# Patient Record
Sex: Male | Born: 1995 | Race: White | Hispanic: No | Marital: Single | Smoking: Never smoker
Health system: Southern US, Community
[De-identification: ages and names within clinical notes are randomized; demographics above are authoritative.]

## PROBLEM LIST (undated history)

## (undated) DIAGNOSIS — J343 Hypertrophy of nasal turbinates: Secondary | ICD-10-CM

## (undated) DIAGNOSIS — J342 Deviated nasal septum: Secondary | ICD-10-CM

## (undated) DIAGNOSIS — J3489 Other specified disorders of nose and nasal sinuses: Secondary | ICD-10-CM

## (undated) DIAGNOSIS — F988 Other specified behavioral and emotional disorders with onset usually occurring in childhood and adolescence: Secondary | ICD-10-CM

## (undated) HISTORY — PX: WISDOM TOOTH EXTRACTION: SHX21

---
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
## 2015-08-21 NOTE — Discharge Instructions (Signed)
[facility] °[facility] °ENDOSCOPIC SINUS SURGERY °[facility] ° °What is Functional Endoscopic Sinus Surgery? ° The Surgery involves making the natural openings of the sinuses larger by removing the bony partitions that separate the sinuses from the nasal cavity.  The natural sinus lining is preserved as much as possible to allow the sinuses to resume normal function after the surgery.  In some patients nasal polyps (excessively swollen lining of the sinuses) may be removed to relieve obstruction of the sinus openings.  The surgery is performed through the nose using lighted scopes, which eliminates the need for incisions on the face.  A septoplasty is a different procedure which is sometimes performed with sinus surgery.  It involves straightening the boy partition that separates the two sides of your nose.  A crooked or deviated septum may need repair if is obstructing the sinuses or nasal airflow.  Turbinate reduction is also often performed during sinus surgery.  The turbinates are bony proturberances from the side walls of the nose which swell and can obstruct the nose in patients with sinus and allergy problems.  Their size can be surgically reduced to help relieve nasal obstruction. ° °What Can Sinus Surgery Do For Me? ° Sinus surgery can reduce the frequency of sinus infections requiring antibiotic treatment.  This can provide improvement in nasal congestion, post-nasal drainage, facial pressure and nasal obstruction.  Surgery will NOT prevent you from ever having an infection again, so it usually only for patients who get infections 4 or more times yearly requiring antibiotics, or for infections that do not clear with antibiotics.  It will not cure nasal allergies, so patients with allergies may still require medication to treat their allergies after surgery. Surgery may improve headaches related to sinusitis, however, some people will continue to  require medication to control sinus headaches related to allergies.  Surgery will do nothing for other forms of headache (migraine, tension or cluster). ° °What Are the Risks of Endoscopic Sinus Surgery? ° Current techniques allow surgery to be performed safely with little risk, however, there are rare complications that patients should be aware of.  Because the sinuses are located around the eyes, there is risk of eye injury, including blindness, though again, this would be quite rare. This is usually a result of bleeding behind the eye during surgery, which puts the vision oat risk, though there are treatments to protect the vision and prevent permanent disrupted by surgery causing a leak of the spinal fluid that surrounds the brain.  More serious complications would include bleeding inside the brain cavity or damage to the brain.  Again, all of these complications are uncommon, and spinal fluid leaks can be safely managed surgically if they occur.  The most common complication of sinus surgery is bleeding from the nose, which may require packing or cauterization of the nose.  Continued sinus have polyps may experience recurrence of the polyps requiring revision surgery.  Alterations of sense of smell or injury to the tear ducts are also rare complications.  ° °What is the Surgery Like, and what is the Recovery? ° The Surgery usually takes a couple of hours to perform, and is usually performed under a general anesthetic (completely asleep).  Patients are usually discharged home after a couple of hours.  Sometimes during surgery it is necessary to pack the nose to control bleeding, and the packing is left in place for 24 - 48 hours, and removed by your surgeon.    If a septoplasty was performed during the procedure, there is often a splint placed which must be removed after 5-7 days.   °Discomfort: Pain is usually mild to moderate, and can be controlled by prescription pain medication or acetaminophen (Tylenol).   Aspirin, Ibuprofen (Advil, Motrin), or Naprosyn (Aleve) should be avoided, as they can cause increased bleeding.  Most patients feel sinus pressure like they have a bad head cold for several days.  Sleeping with your head elevated can help reduce swelling and facial pressure, as can ice packs over the face.  A humidifier may be helpful to keep the mucous and blood from drying in the nose.  ° °Diet: There are no specific diet restrictions, however, you should generally start with clear liquids and a light diet of bland foods because the anesthetic can cause some nausea.  Advance your diet depending on how your stomach feels.  Taking your pain medication with food will often help reduce stomach upset which pain medications can cause. ° °Nasal Saline Irrigation: It is important to remove blood clots and dried mucous from the nose as it is healing.  This is done by having you irrigate the nose at least 3 - 4 times daily with a salt water solution.  We recommend using NeilMed Sinus Rinse (available at the drug store).  Fill the squeeze bottle with the solution, bend over a sink, and insert the tip of the squeeze bottle into the nose ½ of an inch.  Point the tip of the squeeze bottle towards the inside corner of the eye on the same side your irrigating.  Squeeze the bottle and gently irrigate the nose.  If you bend forward as you do this, most of the fluid will flow back out of the nose, instead of down your throat.   The solution should be warm, near body temperature, when you irrigate.   Each time you irrigate, you should use a full squeeze bottle.  ° °Note that if you are instructed to use Nasal Steroid Sprays at any time after your surgery, irrigate with saline BEFORE using the steroid spray, so you do not wash it all out of the nose. °Another product, Nasal Saline Gel (such as AYR Nasal Saline Gel) can be applied in each nostril 3 - 4 times daily to moisture the nose and reduce scabbing or crusting. ° °Bleeding:   Bloody drainage from the nose can be expected for several days, and patients are instructed to irrigate their nose frequently with salt water to help remove mucous and blood clots.  The drainage may be dark red or brown, though some fresh blood may be seen intermittently, especially after irrigation.  Do not blow you nose, as bleeding may occur. If you must sneeze, keep your mouth open to allow air to escape through your mouth. ° °If heavy bleeding occurs: Irrigate the nose with saline to rinse out clots, then spray the nose 3 - 4 times with Afrin Nasal Decongestant Spray.  The spray will constrict the blood vessels to slow bleeding.  Pinch the lower half of your nose shut to apply pressure, and lay down with your head elevated.  Ice packs over the nose may help as well. If bleeding persists despite these measures, you should notify your doctor.  Do not use the Afrin routinely to control nasal congestion after surgery, as it can result in worsening congestion and may affect healing.  ° ° ° °Activity: Return to work varies among patients. Most patients will be   out of work at least 5 - 7 days to recover.  Patient may return to work after they are off of narcotic pain medication, and feeling well enough to perform the functions of their job.  Patients must avoid heavy lifting (over 10 pounds) or strenuous physical for 2 weeks after surgery, so your employer may need to assign you to light duty, or keep you out of work longer if light duty is not possible.  NOTE: you should not drive, operate dangerous machinery, do any mentally demanding tasks or make any important legal or financial decisions while on narcotic pain medication and recovering from the general anesthetic.  °  °Call Your Doctor Immediately if You Have Any of the Following: °1. Bleeding that you cannot control with the above measures °2. Loss of vision, double vision, bulging of the eye or black eyes. °3. Fever over 101 degrees °4. Neck stiffness with  severe headache, fever, nausea and change in mental state. °You are always encourage to call anytime with concerns, however, please call with requests for pain medication refills during office hours. ° °Office Endoscopy: During follow-up visits your doctor will remove any packing or splints that may have been placed and evaluate and clean your sinuses endoscopically.  Topical anesthetic will be used to make this as comfortable as possible, though you may want to take your pain medication prior to the visit.  How often this will need to be done varies from patient to patient.  After complete recovery from the surgery, you may need follow-up endoscopy from time to time, particularly if there is concern of recurrent infection or nasal polyps. ° °General Anesthesia, Adult, Care After °Refer to this sheet in the next few weeks. These instructions provide you with information on caring for yourself after your procedure. Your health care provider may also give you more specific instructions. Your treatment has been planned according to current medical practices, but problems sometimes occur. Call your health care provider if you have any problems or questions after your procedure. °WHAT TO EXPECT AFTER THE PROCEDURE °After the procedure, it is typical to experience: °· Sleepiness. °· Nausea and vomiting. °HOME CARE INSTRUCTIONS °· For the first 24 hours after general anesthesia: °¨ Have a responsible person with you. °¨ Do not drive a car. If you are alone, do not take public transportation. °¨ Do not drink alcohol. °¨ Do not take medicine that has not been prescribed by your health care provider. °¨ Do not sign important papers or make important decisions. °¨ You may resume a normal diet and activities as directed by your health care provider. °· Change bandages (dressings) as directed. °· If you have questions or problems that seem related to general anesthesia, call the hospital and ask for the anesthetist or  anesthesiologist on call. °SEEK MEDICAL CARE IF: °· You have nausea and vomiting that continue the day after anesthesia. °· You develop a rash. °SEEK IMMEDIATE MEDICAL CARE IF:  °· You have difficulty breathing. °· You have chest pain. °· You have any allergic problems. °  °This information is not intended to replace advice given to you by your health care provider. Make sure you discuss any questions you have with your health care provider. °  °Document Released: 12/13/2000 Document Revised: 09/27/2014 Document Reviewed: 01/05/2012 °Elsevier Interactive Patient Education ©2016 [employer] ° °

## 2015-08-22 ENCOUNTER — Encounter
Admission: RE | Disposition: A | Discharge status: Home / Self Care | Payer: Self-pay | Source: Ambulatory Visit | Attending: Unknown Physician Specialty

## 2015-08-22 ENCOUNTER — Ambulatory Visit: Payer: BLUE CROSS/BLUE SHIELD | Admitting: Anesthesiology

## 2015-08-22 ENCOUNTER — Ambulatory Visit
Admission: RE | Admit: 2015-08-22 | Discharge: 2015-08-22 | Disposition: A | Discharge status: Home / Self Care | Payer: BLUE CROSS/BLUE SHIELD | Source: Ambulatory Visit | Attending: Unknown Physician Specialty | Admitting: Unknown Physician Specialty

## 2015-08-22 DIAGNOSIS — J343 Hypertrophy of nasal turbinates: Secondary | ICD-10-CM | POA: Diagnosis not present

## 2015-08-22 DIAGNOSIS — J342 Deviated nasal septum: Secondary | ICD-10-CM | POA: Diagnosis not present

## 2015-08-22 DIAGNOSIS — J3489 Other specified disorders of nose and nasal sinuses: Secondary | ICD-10-CM | POA: Diagnosis present

## 2015-08-22 HISTORY — PX: SEPTOPLASTY: SHX2393

## 2015-08-22 HISTORY — DX: Other specified disorders of nose and nasal sinuses: J34.89

## 2015-08-22 HISTORY — DX: Hypertrophy of nasal turbinates: J34.3

## 2015-08-22 HISTORY — DX: Deviated nasal septum: J34.2

## 2015-08-22 HISTORY — PX: TURBINATE RESECTION: SHX6158

## 2015-08-22 SURGERY — SEPTOPLASTY, NOSE
Anesthesia: General | Wound class: Clean Contaminated

## 2015-08-22 MED ORDER — MIDAZOLAM HCL 5 MG/5ML IJ SOLN
INTRAMUSCULAR | Status: DC | PRN
  Administered 2015-08-22: 2 mg via INTRAVENOUS

## 2015-08-22 MED ORDER — LACTATED RINGERS IV SOLN
INTRAVENOUS | Status: DC
  Administered 2015-08-22 (×2): via INTRAVENOUS

## 2015-08-22 MED ORDER — ACETAMINOPHEN 325 MG PO TABS: 325.0000 mg | ORAL_TABLET | ORAL | Status: DC | PRN

## 2015-08-22 MED ORDER — SULFAMETHOXAZOLE-TRIMETHOPRIM 400-80 MG PO TABS: 1.0000 | ORAL_TABLET | Freq: Two times a day (BID) | ORAL | Status: AC

## 2015-08-22 MED ORDER — OXYCODONE HCL 5 MG PO TABS
5.0000 mg | ORAL_TABLET | Freq: Once | ORAL | Status: AC | PRN
  Administered 2015-08-22: 5 mg via ORAL

## 2015-08-22 MED ORDER — HYDROMORPHONE HCL 1 MG/ML IJ SOLN
0.2500 mg | INTRAMUSCULAR | Status: DC | PRN
  Administered 2015-08-22 (×2): 0.4 mg via INTRAVENOUS
  Administered 2015-08-22: 0.2 mg via INTRAVENOUS

## 2015-08-22 MED ORDER — LIDOCAINE HCL 1 % IJ SOLN
INTRAMUSCULAR | Status: DC | PRN
  Administered 2015-08-22: 30 mL via TOPICAL

## 2015-08-22 MED ORDER — GLYCOPYRROLATE 0.2 MG/ML IJ SOLN
INTRAMUSCULAR | Status: DC | PRN
  Administered 2015-08-22: 0.1 mg via INTRAVENOUS

## 2015-08-22 MED ORDER — ONDANSETRON HCL 4 MG/2ML IJ SOLN: 4.0000 mg | Freq: Once | INTRAMUSCULAR | Status: DC | PRN

## 2015-08-22 MED ORDER — HYDROCODONE-ACETAMINOPHEN 5-300 MG PO TABS: 1.0000 | ORAL_TABLET | ORAL | Status: AC | PRN

## 2015-08-22 MED ORDER — ROCURONIUM BROMIDE 100 MG/10ML IV SOLN
INTRAVENOUS | Status: DC | PRN
  Administered 2015-08-22: 20 mg via INTRAVENOUS

## 2015-08-22 MED ORDER — ACETAMINOPHEN 160 MG/5ML PO SOLN: 325.0000 mg | ORAL | Status: DC | PRN

## 2015-08-22 MED ORDER — PROPOFOL 10 MG/ML IV BOLUS
INTRAVENOUS | Status: DC | PRN
  Administered 2015-08-22: 200 mg via INTRAVENOUS

## 2015-08-22 MED ORDER — LIDOCAINE HCL (CARDIAC) 20 MG/ML IV SOLN
INTRAVENOUS | Status: DC | PRN
  Administered 2015-08-22: 40 mg via INTRAVENOUS

## 2015-08-22 MED ORDER — OXYCODONE HCL 5 MG/5ML PO SOLN: 5.0000 mg | Freq: Once | ORAL | Status: AC | PRN

## 2015-08-22 MED ORDER — ONDANSETRON HCL 4 MG/2ML IJ SOLN
INTRAMUSCULAR | Status: DC | PRN
  Administered 2015-08-22: 4 mg via INTRAVENOUS

## 2015-08-22 MED ORDER — FENTANYL CITRATE (PF) 100 MCG/2ML IJ SOLN
INTRAMUSCULAR | Status: DC | PRN
  Administered 2015-08-22: 100 ug via INTRAVENOUS

## 2015-08-22 MED ORDER — LIDOCAINE-EPINEPHRINE 1 %-1:100000 IJ SOLN
INTRAMUSCULAR | Status: DC | PRN
  Administered 2015-08-22: 12 mL

## 2015-08-22 MED ORDER — DEXAMETHASONE SODIUM PHOSPHATE 4 MG/ML IJ SOLN
INTRAMUSCULAR | Status: DC | PRN
  Administered 2015-08-22: 10 mg via INTRAVENOUS

## 2015-08-22 SURGICAL SUPPLY — 30 items
BLADE SURG 15 STRL LF DISP TIS (BLADE) IMPLANT
BLADE SURG 15 STRL SS (BLADE)
COAG SUCT 10F 3.5MM HAND CTRL (MISCELLANEOUS) ×2 IMPLANT
DRAPE HEAD BAR (DRAPES) ×2 IMPLANT
DRESSING NASL FOAM PST OP SINU (MISCELLANEOUS) IMPLANT
DRSG NASAL FOAM POST OP SINU (MISCELLANEOUS)
GLOVE BIO SURGEON STRL SZ7.5 (GLOVE) ×4 IMPLANT
HANDLE YANKAUER SUCT BULB TIP (MISCELLANEOUS) ×2 IMPLANT
KIT ROOM TURNOVER OR (KITS) ×2 IMPLANT
NEEDLE HYPO 25GX1X1/2 BEV (NEEDLE) ×2 IMPLANT
NS IRRIG 500ML POUR BTL (IV SOLUTION) ×2 IMPLANT
PACK DRAPE NASAL/ENT (PACKS) ×2 IMPLANT
PAD GROUND ADULT SPLIT (MISCELLANEOUS) ×2 IMPLANT
SOL ANTI-FOG 6CC FOG-OUT (MISCELLANEOUS) ×1 IMPLANT
SOL FOG-OUT ANTI-FOG 6CC (MISCELLANEOUS) ×1
SPLINT NASAL SEPTAL BLV .25 LG (MISCELLANEOUS) IMPLANT
SPLINT NASAL SEPTAL BLV .50 ST (MISCELLANEOUS) ×2 IMPLANT
SPONGE NEURO XRAY DETECT 1X3 (DISPOSABLE) ×2 IMPLANT
STRAP BODY AND KNEE 60X3 (MISCELLANEOUS) ×2 IMPLANT
SUT CHROMIC 3-0 (SUTURE) ×1
SUT CHROMIC 3-0 KS 27XMFL CR (SUTURE) ×1
SUT CHROMIC 5-0 (SUTURE)
SUT CHROMIC 5-0 P2 18XMFL CR (SUTURE)
SUT ETHILON 3-0 KS 30 BLK (SUTURE) ×2 IMPLANT
SUT PLAIN GUT 4-0 (SUTURE) IMPLANT
SUTURE CHRMC 3-0 KS 27XMFL CR (SUTURE) ×1 IMPLANT
SUTURE CHRMC 5-0 P2 18XMF CR (SUTURE) IMPLANT
SYRINGE 10CC LL (SYRINGE) ×2 IMPLANT
TOWEL OR 17X26 4PK STRL BLUE (TOWEL DISPOSABLE) ×2 IMPLANT
WATER STERILE IRR 500ML POUR (IV SOLUTION) ×2 IMPLANT

## 2015-08-22 NOTE — Anesthesia Preprocedure Evaluation (Signed)
Anesthesia Evaluation  Patient identified by MRN, date of birth, ID band Patient awake    Reviewed: Allergy & Precautions, H&P , NPO status , Patient's Chart, lab work & pertinent test results, reviewed documented beta blocker date and time   Airway Mallampati: II  TM Distance: >3 FB Neck ROM: full    Dental no notable dental hx.    Pulmonary neg pulmonary ROS,    Pulmonary exam normal breath sounds clear to auscultation       Cardiovascular Exercise Tolerance: Good negative cardio ROS Normal cardiovascular exam Rhythm:regular Rate:Normal     Neuro/Psych negative neurological ROS  negative psych ROS   GI/Hepatic negative GI ROS, Neg liver ROS,   Endo/Other  negative endocrine ROS  Renal/GU negative Renal ROS  negative genitourinary   Musculoskeletal   Abdominal   Peds  Hematology negative hematology ROS (+)   Anesthesia Other Findings   Reproductive/Obstetrics negative OB ROS                             Anesthesia Physical Anesthesia Plan  ASA: I  Anesthesia Plan: General   Post-op Pain Management:    Induction: Intravenous  Airway Management Planned: Oral ETT  Additional Equipment:   Intra-op Plan:   Post-operative Plan: Extubation in OR  Informed Consent: I have reviewed the patients History and Physical, chart, labs and discussed the procedure including the risks, benefits and alternatives for the proposed anesthesia with the patient or authorized representative who has indicated his/her understanding and acceptance.   Dental Advisory Given  Plan Discussed with: CRNA  Anesthesia Plan Comments:         Anesthesia Quick Evaluation

## 2015-08-22 NOTE — Anesthesia Postprocedure Evaluation (Signed)
Anesthesia Post Note  Patient: Paul CockayneLiam L Whitney  Procedure(s) Performed: Procedure(s) (LRB): SEPTOPLASTY (N/A) BI SUBMUCOSA TURBINATE RESECTION (N/A)  Patient location during evaluation: PACU Anesthesia Type: General Level of consciousness: awake and alert Pain management: pain level controlled Vital Signs Assessment: post-procedure vital signs reviewed and stable Respiratory status: spontaneous breathing, nonlabored ventilation and respiratory function stable Cardiovascular status: blood pressure returned to baseline and stable Postop Assessment: no signs of nausea or vomiting Anesthetic complications: no    Alta CorningBacon, Geoffry Bannister S

## 2015-08-22 NOTE — Transfer of Care (Signed)
Immediate Anesthesia Transfer of Care Note  Patient: Paul CockayneLiam L Whitney  Procedure(s) Performed: Procedure(s) with comments: SEPTOPLASTY (N/A) - PT WANTS TO BE LATEST APPOINTMENT. HAS CLASS UNTIL 12:30 THAT DAY AT [facility] BI SUBMUCOSA TURBINATE RESECTION (N/A)  Patient Location: PACU  Anesthesia Type: General  Level of Consciousness: awake, alert  and patient cooperative  Airway and Oxygen Therapy: Patient Spontanous Breathing and Patient connected to supplemental oxygen  Post-op Assessment: Post-op Vital signs reviewed, Patient's Cardiovascular Status Stable, Respiratory Function Stable, Patent Airway and No signs of Nausea or vomiting  Post-op Vital Signs: Reviewed and stable  Complications: No apparent anesthesia complications

## 2015-08-22 NOTE — Op Note (Signed)
PREOPERATIVE DIAGNOSIS:  Chronic nasal obstruction.  POSTOPERATIVE DIAGNOSIS:  Chronic nasal obstruction.  SURGEON:  Davina Pokehapman T. Nelma Phagan, M.D.  NAME OF PROCEDURE:  1. Nasal septoplasty. 2. Submucous resection of inferior turbinates.  OPERATIVE FINDINGS:  Severe nasal septal deformity, hypertrophy of the inferior turbinates.   DESCRIPTION OF THE PROCEDURE:  Paul Whitney was identified in the holding area and taken to the operating room and placed in the supine position.  After general endotracheal anesthesia was induced, the table was turned 45 degrees and the patient was placed in a semi-Fowler position.  The nose was then topically anesthetized with Lidocaine, cotton pledgets were placed within each nostril. After approximately 5 minutes, this was removed at which time a local anesthetic of 1% Lidocaine 1:100,000 units of Epinephrine was used to inject the inferior turbinates in the nasal septum. A total of 12ml ml was used. Examination of the nose showed a severe right nasal septal deformity and tremendous hypertrophied inferior turbinate.  Beginning on the right hand side a hemitransfixion incision was then created on the leading edge of the septum on the right.  A subperichondrial plane was elevated posteriorly on the left and taken back to the perpendicular plate of the ethmoid where subperiosteal plane was elevated posteriorly on the left. A large septal spur was identified on the right hand side impacting on the inferior turbinate.  An inferior rim of cartilage was removed anteriorly with care taken to leave an anterior strut to prevent nasal collapse. With this strut removed the perpendicular plate of the ethmoid was separated from the quadrangular cartilage. The large septal spur was removed.  The septum was then replaced in the midline. Reinspection through each nostril showed excellent reduction of the septal deformity. A left posterior inferior fenestration was then created to allow hematoma  drainage.  With the septoplasty completed, beginning on the left-hand side, a 15 blade was used to incise along the inferior edge of the inferior turbinate. A superior laterally based flap was then elevated. The underlying conchal bone of mucosa was excised using Knight scissors. The flap was then laid back over the turbinate stump and cauterized using suction cautery. In a similar fashion the submucous resection was performed on the right.  With the submucous resection completed bilaterally and no active bleeding, the hemitransfixion incision was then closed using two interrupted 3-0 chromic sutures.  Plastic nasal septal splints were placed within each nostril and affixed to the septum using a 3-0 nylon suture. Stammberger was then used beneath each inferior turbinate for hemostasis.    The patient tolerated the procedure well, was returned to anesthesia, extubated in the operating room, and taken to the recovery room in stable condition.    CULTURES:  None.  SPECIMENS:  None.  ESTIMATED BLOOD LOSS:  25 cc.  [name] T  08/22/2015  1:42 PM

## 2015-08-22 NOTE — Anesthesia Procedure Notes (Signed)
Procedure Name: Intubation Date/Time: 08/22/2015 12:59 PM Performed by: Jimmy PicketAMYOT, Evolette Pendell Pre-anesthesia Checklist: Patient identified, Emergency Drugs available, Suction available, Patient being monitored and Timeout performed Patient Re-evaluated:Patient Re-evaluated prior to inductionOxygen Delivery Method: Circle system utilized Preoxygenation: Pre-oxygenation with 100% oxygen Intubation Type: IV induction Ventilation: Mask ventilation without difficulty Laryngoscope Size: Miller and 3 Grade View: Grade I Tube type: Oral Rae Tube size: 7.5 mm Number of attempts: 1 Placement Confirmation: ETT inserted through vocal cords under direct vision,  positive ETCO2 and breath sounds checked- equal and bilateral Tube secured with: Tape Dental Injury: Teeth and Oropharynx as per pre-operative assessment

## 2015-08-22 NOTE — H&P (Signed)
  H+P  Reviewed and will be scanned in later. No changes noted. 

## 2015-08-25 ENCOUNTER — Encounter: Payer: Self-pay | Admitting: Unknown Physician Specialty

## 2015-10-24 ENCOUNTER — Emergency Department
Admission: EM | Admit: 2015-10-24 | Discharge: 2015-10-24 | Disposition: A | Discharge status: Home / Self Care | Payer: BLUE CROSS/BLUE SHIELD | Attending: Emergency Medicine | Admitting: Emergency Medicine

## 2015-10-24 ENCOUNTER — Emergency Department: Payer: BLUE CROSS/BLUE SHIELD

## 2015-10-24 ENCOUNTER — Encounter: Payer: Self-pay | Admitting: Emergency Medicine

## 2015-10-24 DIAGNOSIS — T148XXA Other injury of unspecified body region, initial encounter: Secondary | ICD-10-CM

## 2015-10-24 DIAGNOSIS — W2103XA Struck by baseball, initial encounter: Secondary | ICD-10-CM | POA: Diagnosis not present

## 2015-10-24 DIAGNOSIS — Y92320 Baseball field as the place of occurrence of the external cause: Secondary | ICD-10-CM | POA: Diagnosis not present

## 2015-10-24 DIAGNOSIS — S8012XA Contusion of left lower leg, initial encounter: Secondary | ICD-10-CM | POA: Insufficient documentation

## 2015-10-24 DIAGNOSIS — S81812A Laceration without foreign body, left lower leg, initial encounter: Secondary | ICD-10-CM | POA: Diagnosis not present

## 2015-10-24 DIAGNOSIS — Z79899 Other long term (current) drug therapy: Secondary | ICD-10-CM | POA: Diagnosis not present

## 2015-10-24 DIAGNOSIS — Y998 Other external cause status: Secondary | ICD-10-CM | POA: Insufficient documentation

## 2015-10-24 DIAGNOSIS — Y9364 Activity, baseball: Secondary | ICD-10-CM | POA: Diagnosis not present

## 2015-10-24 MED ORDER — CEPHALEXIN 500 MG PO CAPS: 500.0000 mg | ORAL_CAPSULE | Freq: Three times a day (TID) | ORAL | Status: AC

## 2015-10-24 MED ORDER — IBUPROFEN 800 MG PO TABS: 800.0000 mg | ORAL_TABLET | Freq: Three times a day (TID) | ORAL | Status: AC | PRN

## 2015-10-24 MED ORDER — IBUPROFEN 800 MG PO TABS
800.0000 mg | ORAL_TABLET | Freq: Once | ORAL | Status: AC
  Administered 2015-10-24: 800 mg via ORAL
  Filled 2015-10-24: qty 1

## 2015-10-24 NOTE — Discharge Instructions (Signed)

## 2015-10-24 NOTE — ED Provider Notes (Signed)
CSN: [id number]     Arrival date & time 10/24/15  1641 History   First MD Initiated Contact with Patient 10/24/15 1729     Chief Complaint  Patient presents with  . Laceration  . Leg Pain     (Consider location/radiation/quality/duration/timing/severity/associated sxs/prior Treatment) HPI  20 year old male resents to the emergency department for evaluation of left shin pain. Patient suffered a laceration to the left shin while playing baseball. Patient without a baseball off his left shin. Patient ambulatory with moderate pain. Ambulates and assisted devices. Tetanus is up-to-date. Bleeding controlled. Patient denies any knee or ankle pain.  Past Medical History  Diagnosis Date  . ADD (attention deficit disorder)   . Nasal obstruction   . Nasal turbinate hypertrophy   . Deviated nasal septum    Past Surgical History  Procedure Laterality Date  . Wisdom tooth extraction    . Septoplasty N/A 08/22/2015    Procedure: SEPTOPLASTY;  Surgeon: Linus Salmons, MD;  Location: [facility];  Service: ENT;  Laterality: N/A;  PT WANTS TO BE LATEST APPOINTMENT. HAS CLASS UNTIL 12:30 THAT DAY AT [facility]  . Turbinate resection N/A 08/22/2015    Procedure: BI SUBMUCOSA TURBINATE RESECTION;  Surgeon: Linus Salmons, MD;  Location: [facility];  Service: ENT;  Laterality: N/A;   No family history on file. Social History  Substance Use Topics  . Smoking status: Never Smoker   . Smokeless tobacco: Never Used  . Alcohol Use: No    Review of Systems  Constitutional: Negative.  Negative for fever, chills, activity change and appetite change.  HENT: Negative for congestion, ear pain, mouth sores, rhinorrhea, sinus pressure, sore throat and trouble swallowing.   Eyes: Negative for photophobia, pain and discharge.  Respiratory: Negative for cough, chest tightness and shortness of breath.   Cardiovascular: Negative for chest pain and leg swelling.  Gastrointestinal: Negative for nausea,  vomiting, abdominal pain, diarrhea and abdominal distention.  Genitourinary: Negative for dysuria and difficulty urinating.  Musculoskeletal: Negative for back pain, arthralgias and gait problem.  Skin: Positive for wound (left tibia). Negative for color change and rash.  Neurological: Negative for dizziness and headaches.  Hematological: Negative for adenopathy.  Psychiatric/Behavioral: Negative for behavioral problems and agitation.      Allergies  Review of patient's allergies indicates no known allergies.  Home Medications   Prior to Admission medications   Medication Sig Start Date End Date Taking? Authorizing Provider  amphetamine-dextroamphetamine (ADDERALL) 10 MG tablet Take 10 mg by mouth daily. AM    Historical Provider, MD  cephALEXin (KEFLEX) 500 MG capsule Take 1 capsule (500 mg total) by mouth 3 (three) times daily. 10/24/15 11/03/15  Evon Slack, PA-C  Hydrocodone-Acetaminophen 5-300 MG TABS Take 1-2 tablets by mouth every 4 (four) hours as needed. 08/22/15   Linus Salmons, MD  ibuprofen (ADVIL,MOTRIN) 800 MG tablet Take 1 tablet (800 mg total) by mouth every 8 (eight) hours as needed. 10/24/15   Evon Slack, PA-C  sulfamethoxazole-trimethoprim (BACTRIM) 400-80 MG tablet Take 1 tablet by mouth 2 (two) times daily. 08/22/15   Linus Salmons, MD   BP 138/104 mmHg  Pulse 82  Temp(Src) 98.3 F (36.8 C) (Oral)  Resp 18  Ht  (1.88 m)  Wt 99.791 kg  BMI 28.23 kg/m2  SpO2 97% Physical Exam  Constitutional: He is oriented to person, place, and time. He appears well-developed and well-nourished.  HENT:  Head: Normocephalic and atraumatic.  Eyes: Conjunctivae and EOM are normal.  Pupils are equal, round, and reactive to light.  Neck: Normal range of motion. Neck supple.  Cardiovascular: Normal rate and intact distal pulses.   Pulmonary/Chest: Effort normal. No respiratory distress.  Musculoskeletal: Normal range of motion. He exhibits no edema or tenderness.   Full range of motion of the knee and ankle with no discomfort. Tender to palpation along the tibial shaft.  Neurological: He is alert and oriented to person, place, and time.  Skin: Skin is warm and dry.  2 cm laceration left leg with no sign of foreign body. Patient tender to palpation along the tibial shaft  Psychiatric: He has a normal mood and affect. His behavior is normal. Judgment and thought content normal.    ED Course  Procedures (including critical care time) LACERATION REPAIR Performed by: Patience Musca Authorized by: Patience Musca Consent: Verbal consent obtained. Risks and benefits: risks, benefits and alternatives were discussed Consent given by: patient Patient identity confirmed: provided demographic data Prepped and Draped in normal sterile fashion Wound explored  Laceration Location: Left tibia  Laceration Length: 2 cm  No Foreign Bodies seen or palpated  Anesthesia: local infiltration  Local anesthetic: lidocaine 1% % with epinephrine  Anesthetic total: 1.5 ml  Irrigation method: syringe Amount of cleaning: standard  Skin closure: Simple interrupted 4-0 nylon   Number of sutures: 2   Technique: Simple interrupted   Patient tolerance: Patient tolerated the procedure well with no immediate complications.  Labs Review Labs Reviewed - No data to display  Imaging Review Dg Tibia/fibula Left  10/24/2015  CLINICAL DATA:  Lacerationto LEFT shin. Sports injury while playing basketball the EXAM: LEFT TIBIA AND FIBULA - 2 VIEW COMPARISON:  None. FINDINGS: No fracture of the tibia or fibula. Knee joint and ankle joint appear normal on two views. IMPRESSION: No acute osseous abnormality. Electronically Signed   By: Genevive Bi M.D.   On: 10/24/2015 18:31   I have personally reviewed and evaluated these images and lab results as part of my medical decision-making.   EKG Interpretation None      MDM   Final diagnoses:   Laceration of left leg, initial encounter  Bone bruise    20 year old male with small to similar laceration to the left tibia after failing a ball off of his leg. Moderate tenderness palpation along the tibial shaft. X-ray showed no evidence of acute bony abnormality. He is weightbearing with no assisted devices. Laceration repaired with 2 4-0 nylon sutures. Tetanus is up-to-date. Patient given 100 mg ibuprofen tablets for pain. Keflex 500 mg 1 tab by mouth 3 times a day when necessary.    Evon Slack, PA-C 10/24/15 1906  Sharyn Creamer, MD 10/25/15 [phone]

## 2015-10-24 NOTE — ED Notes (Signed)
Pt here with c/o lac and pain to left shin. Was playing baseball, went to catch the ball and it hit his shin, causing it to "split open" per pt. Site wrapped by trainer. Pt states the ball was moving fast enough to break or chip a bone. Walked with limp to triage.

## 2015-10-24 NOTE — ED Notes (Signed)
Left leg laceration to shin, bleeding controlled at this time, pt states he was hit with a baseball that he fouled off

## 2015-11-03 DIAGNOSIS — Z4802 Encounter for removal of sutures: Secondary | ICD-10-CM

## 2016-09-28 ENCOUNTER — Ambulatory Visit
Admission: RE | Admit: 2016-09-28 | Discharge: 2016-09-28 | Disposition: A | Discharge status: Home / Self Care | Payer: BLUE CROSS/BLUE SHIELD | Source: Ambulatory Visit | Attending: Family Medicine | Admitting: Family Medicine

## 2016-09-28 ENCOUNTER — Emergency Department: Payer: BLUE CROSS/BLUE SHIELD

## 2016-09-28 ENCOUNTER — Observation Stay
Admission: EM | Admit: 2016-09-28 | Discharge: 2016-09-29 | Disposition: A | Discharge status: Home / Self Care | Payer: BLUE CROSS/BLUE SHIELD | Attending: Surgery | Admitting: Surgery

## 2016-09-28 ENCOUNTER — Ambulatory Visit (INDEPENDENT_AMBULATORY_CARE_PROVIDER_SITE_OTHER): Payer: BLUE CROSS/BLUE SHIELD | Admitting: Family Medicine

## 2016-09-28 ENCOUNTER — Encounter: Payer: Self-pay | Admitting: Family Medicine

## 2016-09-28 VITALS — BP 128/74 | HR 64 | Temp 97.4°F | Resp 14

## 2016-09-28 DIAGNOSIS — R109 Unspecified abdominal pain: Secondary | ICD-10-CM

## 2016-09-28 DIAGNOSIS — R112 Nausea with vomiting, unspecified: Secondary | ICD-10-CM | POA: Diagnosis not present

## 2016-09-28 DIAGNOSIS — K56609 Unspecified intestinal obstruction, unspecified as to partial versus complete obstruction: Secondary | ICD-10-CM | POA: Diagnosis present

## 2016-09-28 DIAGNOSIS — Z23 Encounter for immunization: Secondary | ICD-10-CM | POA: Diagnosis not present

## 2016-09-28 LAB — COMPREHENSIVE METABOLIC PANEL
ALBUMIN: 5.1 g/dL — AB (ref 3.5–5.0)
ALT: 24 U/L (ref 17–63)
ANION GAP: 8 (ref 5–15)
AST: 34 U/L (ref 15–41)
Alkaline Phosphatase: 82 U/L (ref 38–126)
BUN: 15 mg/dL (ref 6–20)
CO2: 26 mmol/L (ref 22–32)
Calcium: 9.9 mg/dL (ref 8.9–10.3)
Chloride: 103 mmol/L (ref 101–111)
Creatinine, Ser: 1.08 mg/dL (ref 0.61–1.24)
GFR calc Af Amer: >60 mL/min (ref >60)
GFR calc non Af Amer: >60 mL/min (ref >60)
GLUCOSE: 95 mg/dL (ref 65–99)
POTASSIUM: 3.8 mmol/L (ref 3.5–5.1)
Sodium: 137 mmol/L (ref 135–145)
Total Bilirubin: 0.9 mg/dL (ref 0.3–1.2)
Total Protein: 8.5 g/dL — ABNORMAL HIGH (ref 6.5–8.1)

## 2016-09-28 LAB — LIPASE, BLOOD: Lipase: 47 U/L (ref 11–51)

## 2016-09-28 LAB — CBC
HEMATOCRIT: 50.7 % (ref 40.0–52.0)
HEMOGLOBIN: 17.2 g/dL (ref 13.0–18.0)
MCH: 29.9 pg (ref 26.0–34.0)
MCHC: 34 g/dL (ref 32.0–36.0)
MCV: 88 fL (ref 80.0–100.0)
Platelets: 256 10*3/uL (ref 150–440)
RBC: 5.76 MIL/uL (ref 4.40–5.90)
RDW: 12.7 % (ref 11.5–14.5)
WBC: 12.1 10*3/uL — ABNORMAL HIGH (ref 3.8–10.6)

## 2016-09-28 MED ORDER — MORPHINE SULFATE (PF) 4 MG/ML IV SOLN
4.0000 mg | Freq: Once | INTRAVENOUS | Status: AC
  Administered 2016-09-28: 4 mg via INTRAVENOUS
  Filled 2016-09-28: qty 1

## 2016-09-28 MED ORDER — IOPAMIDOL (ISOVUE-300) INJECTION 61%
100.0000 mL | Freq: Once | INTRAVENOUS | Status: AC | PRN
  Administered 2016-09-28: 100 mL via INTRAVENOUS

## 2016-09-28 MED ORDER — ONDANSETRON HCL 4 MG PO TABS: 4.0000 mg | ORAL_TABLET | Freq: Four times a day (QID) | ORAL | Status: DC | PRN

## 2016-09-28 MED ORDER — MORPHINE SULFATE (PF) 2 MG/ML IV SOLN
2.0000 mg | INTRAVENOUS | Status: DC | PRN
  Administered 2016-09-28: 2 mg via INTRAVENOUS
  Filled 2016-09-28: qty 1

## 2016-09-28 MED ORDER — DEXTROSE-NACL 5-0.9 % IV SOLN
INTRAVENOUS | Status: DC
  Administered 2016-09-29 (×3): via INTRAVENOUS

## 2016-09-28 MED ORDER — ONDANSETRON HCL 4 MG/2ML IJ SOLN
INTRAMUSCULAR | Status: AC
  Filled 2016-09-28: qty 2

## 2016-09-28 MED ORDER — ONDANSETRON HCL 4 MG/2ML IJ SOLN
4.0000 mg | Freq: Four times a day (QID) | INTRAMUSCULAR | Status: DC | PRN
  Administered 2016-09-28: 4 mg via INTRAVENOUS
  Filled 2016-09-28: qty 2

## 2016-09-28 MED ORDER — IOPAMIDOL (ISOVUE-300) INJECTION 61%
30.0000 mL | Freq: Once | INTRAVENOUS | Status: AC | PRN
  Administered 2016-09-28: 30 mL via ORAL

## 2016-09-28 NOTE — ED Triage Notes (Signed)
Pt sent to ER for bowel obstruction. NV and abdominal pain since 2AM today.

## 2016-09-28 NOTE — Progress Notes (Signed)
Patient presents today with symptoms of abdominal pain. Patient states that he had chicken wings yesterday which were not spicy. He started to have abdominal pain at around 2 AM. He denies any diarrhea, fever, chest pain, shortness of breath. He denies any new medications or supplements. He did vomit once prior to coming in today. He states that this did help with his symptom he denies any focal area of tenderness. He denies any chronic history of abdominal pain.  ROS: Negative except mentioned above.  Vitals as per Epic.  GENERAL: NAD HEENT: no pharyngeal erythema, no exudate, no erythema of TMs, no cervical LAD RESP: CTA B CARD: RRR ABD: Decreased bowel sounds, no focal tenderness in the right lower quadrant, mild epigastric tenderness, no rebound or guarding appreciated NEURO: CN II-XII grossly intact   A/P: Abdominal pain, vomiting -likely related to gastroenteritis, will do x-ray to evaluate further, will discuss plan of care after x-ray has been reviewed. Patient will seek medical attention if any worsening symptoms develop.

## 2016-09-28 NOTE — ED Provider Notes (Addendum)
[facility] Emergency Department Provider Note  ____________________________________________   I have reviewed the triage vital signs and the nursing notes.   HISTORY  Chief Complaint Abdominal Pain    HPI Paul Whitney is a 21 y.o. male  who is healthy. Has never had abdominal surgery. Denies other medical issues. Presents today complaining of abdominal pain. Began last night. Associated with some vomiting today. No diarrhea. Last bowel movement was normal yesterday. Denies fever or chills. Denies melena or bright red blood per rectum or hematemesis.Patient had an outpatient x-ray performed which showed either ileus or SBO and he was sent in for further evaluation.     Past Medical History:  Diagnosis Date  . ADD (attention deficit disorder)   . Deviated nasal septum   . Nasal obstruction   . Nasal turbinate hypertrophy     There are no active problems to display for this patient.   Past Surgical History:  Procedure Laterality Date  . SEPTOPLASTY N/A 08/22/2015   Procedure: SEPTOPLASTY;  Surgeon: Linus Salmons, MD;  Location: [facility];  Service: ENT;  Laterality: N/A;  PT WANTS TO BE LATEST APPOINTMENT. HAS CLASS UNTIL 12:30 THAT DAY AT [facility]  . TURBINATE RESECTION N/A 08/22/2015   Procedure: BI SUBMUCOSA TURBINATE RESECTION;  Surgeon: Linus Salmons, MD;  Location: [facility];  Service: ENT;  Laterality: N/A;  . WISDOM TOOTH EXTRACTION      Prior to Admission medications   Medication Sig Start Date End Date Taking? Authorizing Provider  amphetamine-dextroamphetamine (ADDERALL) 10 MG tablet Take 10 mg by mouth daily. AM    Historical Provider, MD  Hydrocodone-Acetaminophen 5-300 MG TABS Take 1-2 tablets by mouth every 4 (four) hours as needed. Patient not taking: Reported on 09/28/2016 08/22/15   Linus Salmons, MD  ibuprofen (ADVIL,MOTRIN) 800 MG tablet Take 1 tablet (800 mg total) by mouth every 8 (eight) hours as  needed. Patient not taking: Reported on 09/28/2016 10/24/15   Evon Slack, PA-C  sulfamethoxazole-trimethoprim (BACTRIM) 400-80 MG tablet Take 1 tablet by mouth 2 (two) times daily. Patient not taking: Reported on 09/28/2016 08/22/15   Linus Salmons, MD    Allergies Patient has no known allergies.  No family history on file.  Social History Social History  Substance Use Topics  . Smoking status: Never Smoker  . Smokeless tobacco: Never Used  . Alcohol use No    Review of Systems Constitutional: No fever/chills Eyes: No visual changes. ENT: No sore throat. No stiff neck no neck pain Cardiovascular: Denies chest pain. Respiratory: Denies shortness of breath. Gastrointestinal:   See history of present illness Genitourinary: Negative for dysuria. Musculoskeletal: Negative lower extremity swelling Skin: Negative for rash. Neurological: Negative for severe headaches, focal weakness or numbness. 10-point ROS otherwise negative.  ____________________________________________   PHYSICAL EXAM:  VITAL SIGNS: ED Triage Vitals [09/28/16 1603]  Enc Vitals Group     BP 135/79     Pulse Rate 86     Resp 18     Temp 98.9 F (37.2 C)     Temp Source Oral     SpO2 98 %     Weight 215 lb (97.5 kg)     Height 6\' 2"  (1.88 m)     Head Circumference      Peak Flow      Pain Score 7     Pain Loc      Pain Edu?      Excl. in GC?  Constitutional: Alert and oriented. Well appearing and in no acute distress. Eyes: Conjunctivae are normal. PERRL. EOMI. Head: Atraumatic. Nose: No congestion/rhinnorhea. Mouth/Throat: Mucous membranes are moist.  Oropharynx non-erythematous. Neck: No stridor.   Nontender with no meningismus Cardiovascular: Normal rate, regular rhythm. Grossly normal heart sounds.  Good peripheral circulation. Respiratory: Normal respiratory effort.  No retractions. Lungs CTAB. Abdominal: Soft and He is tender decreased bowel sounds noted, there is some degree of  firmness but no significant distention or rigidity, positive voluntary guarding, no involuntary guarding and no rebound. Back:  There is no focal tenderness or step off.  there is no midline tenderness there are no lesions noted. there is no CVA tenderness Musculoskeletal: No lower extremity tenderness, no upper extremity tenderness. No joint effusions, no DVT signs strong distal pulses no edema Neurologic:  Normal speech and language. No gross focal neurologic deficits are appreciated.  Skin:  Skin is warm, dry and intact. No rash noted. Psychiatric: Mood and affect are normal. Speech and behavior are normal.  ____________________________________________   LABS (all labs ordered are listed, but only abnormal results are displayed)  Labs Reviewed  COMPREHENSIVE METABOLIC PANEL - Abnormal; Notable for the following:       Result Value   Total Protein 8.5 (*)    Albumin 5.1 (*)    All other components within normal limits  CBC - Abnormal; Notable for the following:    WBC 12.1 (*)    All other components within normal limits  LIPASE, BLOOD  URINALYSIS, COMPLETE (UACMP) WITH MICROSCOPIC   ____________________________________________  EKG  I personally interpreted any EKGs ordered by me or triage  ____________________________________________  RADIOLOGY  I reviewed any imaging ordered by me or triage that were performed during my shift and, if possible, patient and/or family made aware of any abnormal findings. ____________________________________________   PROCEDURES  Procedure(s) performed: None  Procedures  Critical Care performed: None  ____________________________________________   INITIAL IMPRESSION / ASSESSMENT AND PLAN / ED COURSE  Pertinent labs & imaging results that were available during my care of the patient were reviewed by me and considered in my medical decision making (see chart for details).  Patient here with what may be an SBO. Very unlikely to be  an SBO by demographic. Of course an internal herniation is possible. I think a CT scan is warranted to further evaluate this atypical presentation. Patient otherwise not toxic in appearance. He does not have any focal tenderness at this time to suggest appendicitis and does not yet have true peritoneal signs from whatever pathologies causing his symptoms. We will treat his pain and reassess.  ----------------------------------------- 8:09 PM on 09/28/2016 -----------------------------------------  D/w dr. Excell Seltzerooper. He is suspicious that this may be an ileus but he will admit.   Clinical Course    ____________________________________________   FINAL CLINICAL IMPRESSION(S) / ED DIAGNOSES  Final diagnoses:  None      This chart was dictated using voice recognition software.  Despite best efforts to proofread,  errors can occur which can change meaning.      Jeanmarie PlantJames A Maris Bena, MD 09/28/16 1902    Jeanmarie PlantJames A Tavone Caesar, MD 09/28/16 2022

## 2016-09-28 NOTE — H&P (Signed)
Paul Whitney is an 21 y.o. male.    Chief Complaint: As a vomiting and abdominal pain  HPI: This a patient with a proximally 18 hours of nausea vomiting and abdominal pain he has vomited twice he stopped passing gas yesterday and has not had a bowel movement since yesterday which was normal with no diarrhea. He's never had an episode like this before and no one else in his household is ill Denies fevers or chills or hematochezia He states he feels better now that he's had some antinausea medication and pain medication he points to his general abdomen in all quadrants as a source of his pain.  Past Medical History:  Diagnosis Date  . ADD (attention deficit disorder)   . Deviated nasal septum   . Nasal obstruction   . Nasal turbinate hypertrophy     Past Surgical History:  Procedure Laterality Date  . SEPTOPLASTY N/A 08/22/2015   Procedure: SEPTOPLASTY;  Surgeon: Beverly Gust, MD;  Location: [facility];  Service: ENT;  Laterality: N/A;  PT WANTS TO BE LATEST APPOINTMENT. HAS CLASS UNTIL 12:30 THAT DAY AT [facility]  . TURBINATE RESECTION N/A 08/22/2015   Procedure: BI SUBMUCOSA TURBINATE RESECTION;  Surgeon: Beverly Gust, MD;  Location: [facility];  Service: ENT;  Laterality: N/A;  . WISDOM TOOTH EXTRACTION      No family history on file. No family history of Crohn's disease Social History:  reports that he has never smoked. He has never used smokeless tobacco. He reports that he does not drink alcohol or use drugs.  Allergies: No Known Allergies   (Not in a hospital admission)   Review of Systems  Constitutional: Negative for chills and fever.  HENT: Negative.   Eyes: Negative.   Respiratory: Negative.   Cardiovascular: Negative.   Gastrointestinal: Positive for abdominal pain, nausea and vomiting. Negative for blood in stool, constipation, diarrhea, heartburn and melena.  Genitourinary: Negative.   Musculoskeletal: Negative.   Skin: Negative.    Neurological: Negative.   Endo/Heme/Allergies: Negative.   Psychiatric/Behavioral: Negative.      Physical Exam:  BP (!) 142/75 (BP Location: Right Arm)   Pulse 63   Temp 97.7 F (36.5 C) (Oral)   Resp 16   Ht 6' 2"  (1.88 m)   Wt 215 lb (97.5 kg)   SpO2 100%   BMI 27.60 kg/m   Physical Exam  Constitutional: He is oriented to person, place, and time and well-developed, well-nourished, and in no distress. No distress.  Muscular-appearing male patient in no acute distress moves about the gurney without difficulty very muscular in nature  HENT:  Head: Normocephalic and atraumatic.  Eyes: Pupils are equal, round, and reactive to light. Right eye exhibits no discharge. Left eye exhibits no discharge. No scleral icterus.  Neck: Normal range of motion. Neck supple.  Cardiovascular: Normal rate, regular rhythm and normal heart sounds.   Pulmonary/Chest: Effort normal and breath sounds normal. No respiratory distress. He has no wheezes. He has no rales.  Abdominal: Soft. He exhibits no distension. There is no tenderness. There is no rebound and no guarding.  Difficult exam due to muscular nature but no real guarding or percussion tenderness and minimal if any tenderness in the abdomen. Nondistended  Musculoskeletal: Normal range of motion. He exhibits no edema or tenderness.  Lymphadenopathy:    He has no cervical adenopathy.  Neurological: He is alert and oriented to person, place, and time.  Skin: Skin is warm and dry. No rash noted.  He is not diaphoretic. No erythema.  Psychiatric: Mood and affect normal.  Vitals reviewed.       Results for orders placed or performed during the hospital encounter of 09/28/16 (from the past 48 hour(s))  Lipase, blood     Status: None   Collection Time: 09/28/16  4:09 PM  Result Value Ref Range   Lipase 47 11 - 51 U/L  Comprehensive metabolic panel     Status: Abnormal   Collection Time: 09/28/16  4:09 PM  Result Value Ref Range   Sodium  137 135 - 145 mmol/L   Potassium 3.8 3.5 - 5.1 mmol/L   Chloride 103 101 - 111 mmol/L   CO2 26 22 - 32 mmol/L   Glucose, Bld 95 65 - 99 mg/dL   BUN 15 6 - 20 mg/dL   Creatinine, Ser 1.08 0.61 - 1.24 mg/dL   Calcium 9.9 8.9 - 10.3 mg/dL   Total Protein 8.5 (H) 6.5 - 8.1 g/dL   Albumin 5.1 (H) 3.5 - 5.0 g/dL   AST 34 15 - 41 U/L   ALT 24 17 - 63 U/L   Alkaline Phosphatase 82 38 - 126 U/L   Total Bilirubin 0.9 0.3 - 1.2 mg/dL   GFR calc non Af Amer >60 >60 mL/min   GFR calc Af Amer >60 >60 mL/min    Comment: (NOTE) The eGFR has been calculated using the CKD EPI equation. This calculation has not been validated in all clinical situations. eGFR's persistently <60 mL/min signify possible Chronic Kidney Disease.    Anion gap 8 5 - 15  CBC     Status: Abnormal   Collection Time: 09/28/16  4:09 PM  Result Value Ref Range   WBC 12.1 (H) 3.8 - 10.6 K/uL   RBC 5.76 4.40 - 5.90 MIL/uL   Hemoglobin 17.2 13.0 - 18.0 g/dL   HCT 50.7 40.0 - 52.0 %   MCV 88.0 80.0 - 100.0 fL   MCH 29.9 26.0 - 34.0 pg   MCHC 34.0 32.0 - 36.0 g/dL   RDW 12.7 11.5 - 14.5 %   Platelets 256 150 - 440 K/uL   Ct Abdomen Pelvis W Contrast  Result Date: 09/28/2016 CLINICAL DATA:  Abdominal pain.  Small-bowel obstruction. EXAM: CT ABDOMEN AND PELVIS WITH CONTRAST TECHNIQUE: Multidetector CT imaging of the abdomen and pelvis was performed using the standard protocol following bolus administration of intravenous contrast. CONTRAST:  141m ISOVUE-300 IOPAMIDOL (ISOVUE-300) INJECTION 61% COMPARISON:  KUB 09/28/2016 FINDINGS: Lower chest: Lung bases clear without infiltrate or effusion Hepatobiliary: Liver gallbladder and bile ducts normal. Pancreas: Negative Spleen: Negative Adrenals/Urinary Tract: Negative Stomach/Bowel: Stomach and duodenum normal. Small bowel is diffusely dilated. Ileum measures 34 mm in diameter. Jejunum measures up to 31 mm in diameter. No bowel wall edema. No hernia or mass lesion. The colon is  decompressed.  Normal appendix. Vascular/Lymphatic: Negative Reproductive: Normal prostate Other: Small to moderate amount of free fluid in the pelvis. No abscess. Musculoskeletal: No acute abnormality. Disc degeneration with Schmorl's nodes at multiple levels. IMPRESSION: Small-bowel obstruction pattern. Small bowel diffusely dilated. Colon decompressed. Normal appendix. Cause of bowel obstruction not identified on this study. Small moderate amount of free fluid in the pelvis. Electronically Signed   By: CFranchot GalloM.D.   On: 09/28/2016 20:00   Dg Abd 2 Views  Result Date: 09/28/2016 CLINICAL DATA:  Abdominal pain.  Vomiting and constipation EXAM: ABDOMEN - 2 VIEW COMPARISON:  None. FINDINGS: Distended small bowel loops with air-fluid levels  in the epigastric region suggesting small bowel obstruction. Colon decompressed. No free air. Ingested crush tablets in the stomach and small bowel. No renal calculi. No acute bony abnormality. Spina bifida occulta S1 IMPRESSION: Small-bowel obstruction pattern.  No free air Electronically Signed   By: Franchot Gallo M.D.   On: 09/28/2016 14:30     Assessment/Plan  CT and plain films are personally reviewed and discussed with both the emergency room physician as well as radiology Dr. Carlis Abbott. Radiopaque media within the small bowel and stomach appears to be from Pepto-Bismol taken by the patient before coming to the emergency room. This was confirmed by history. Patient's history is more consistent with a viral infection causing gastroenteritis is no sign of Crohn's disease his appendix is normal but his colon is collapsed suggestive of possible bowel obstruction. He's not any prior surgery. With that in mind and his nausea and vomiting have recommended admission the hospital hydration and repeat examination even with films in the morning. This is all discussed with he and his family they understood and agreed with this plan  Florene Glen, MD, FACS

## 2016-09-29 ENCOUNTER — Observation Stay: Payer: BLUE CROSS/BLUE SHIELD

## 2016-09-29 DIAGNOSIS — K56609 Unspecified intestinal obstruction, unspecified as to partial versus complete obstruction: Principal | ICD-10-CM

## 2016-09-29 DIAGNOSIS — Z23 Encounter for immunization: Secondary | ICD-10-CM | POA: Insufficient documentation

## 2016-09-29 LAB — CBC
HCT: 46.9 % (ref 40.0–52.0)
Hemoglobin: 16 g/dL (ref 13.0–18.0)
MCH: 30.4 pg (ref 26.0–34.0)
MCHC: 34.1 g/dL (ref 32.0–36.0)
MCV: 89.4 fL (ref 80.0–100.0)
Platelets: 218 10*3/uL (ref 150–440)
RBC: 5.25 MIL/uL (ref 4.40–5.90)
RDW: 12.6 % (ref 11.5–14.5)
WBC: 7.8 10*3/uL (ref 3.8–10.6)

## 2016-09-29 LAB — BASIC METABOLIC PANEL
Anion gap: 6 (ref 5–15)
BUN: 14 mg/dL (ref 6–20)
CALCIUM: 9.3 mg/dL (ref 8.9–10.3)
CHLORIDE: 104 mmol/L (ref 101–111)
CO2: 27 mmol/L (ref 22–32)
CREATININE: 1.09 mg/dL (ref 0.61–1.24)
GFR calc Af Amer: >60 mL/min (ref >60)
GFR calc non Af Amer: >60 mL/min (ref >60)
Glucose, Bld: 106 mg/dL — ABNORMAL HIGH (ref 65–99)
Potassium: 3.9 mmol/L (ref 3.5–5.1)
SODIUM: 137 mmol/L (ref 135–145)

## 2016-09-29 MED ORDER — INFLUENZA VAC SPLIT QUAD 0.5 ML IM SUSY
0.5000 mL | PREFILLED_SYRINGE | INTRAMUSCULAR | Status: AC
  Administered 2016-09-29: 0.5 mL via INTRAMUSCULAR
  Filled 2016-09-29: qty 0.5

## 2016-09-29 MED ORDER — INFLUENZA VAC SPLIT QUAD 0.5 ML IM SUSY: 0.5000 mL | PREFILLED_SYRINGE | INTRAMUSCULAR | Status: DC

## 2016-09-29 NOTE — Discharge Summary (Addendum)
Patient ID: Paul CockayneLiam L Whitney MRN: [id number] DOB/AGE: 21/02/1996 20 y.o.  Admit date: 09/28/2016 Discharge date: 09/29/2016   Discharge Diagnoses:  Active Problems:   SBO (small bowel obstruction)   Procedures:none  Hospital Course: 20yo male previously healthy presented with abdominal pain nausea and vomiting and workup in the emergency room show evidence of dilated loops of small bowel consistent with partial small bowel obstruction. Patient was seen and examined by Dr. Excell Seltzerooper initially and he was arranged feeling better by the time he was examined by my partner. He was kept overnight and and that same night started passing gas no bowel movement. Repeat KUB the following morning show contrast in the distal colon and no evidence of complete obstruction. More importantly clinically he continued to improve his diet wasn't advanced from a clear liquid diet to a regular diet that he tolerated well. At the time of discharge he was ambulating, he was tolerating regular diet. His vital signs were stable. His physical exam at time of discharge she was in no acute distress, alert, oriented. Abdomen: Soft nontender no peritonitis. Extremities: No evidence of edema, well-perfused. Condition of the patient at time of discharge is stable. We also discussed the possibilities of enteritis as the main diagnosis and his father was concerned about possible parasite. I offer to test for*but opened asking the patient he wanted to referred fecal stool samples at this time. I have we will follow him up in 1-2 weeks in the outpatient setting. Visualization of the patient the time of discharge is stable I actually spent approximately 40 minutes in this encounter including personally reviewing his imaging studies along with the patient and his family, answering multiple questions from the family and coordinating his care. Extensive counseling provided   Disposition: 01-Home or Self Care  Discharge Instructions    Call MD  for:  difficulty breathing, headache or visual disturbances    Complete by:  As directed    Call MD for:  extreme fatigue    Complete by:  As directed    Call MD for:  hives    Complete by:  As directed    Call MD for:  persistant dizziness or light-headedness    Complete by:  As directed    Call MD for:  persistant nausea and vomiting    Complete by:  As directed    Call MD for:  redness, tenderness, or signs of infection (pain, swelling, redness, odor or green/yellow discharge around incision site)    Complete by:  As directed    Call MD for:  severe uncontrolled pain    Complete by:  As directed    Call MD for:  temperature >100.4    Complete by:  As directed    Diet - low sodium heart healthy    Complete by:  As directed    Increase activity slowly    Complete by:  As directed      Allergies as of 09/29/2016   No Known Allergies     Medication List    TAKE these medications   amphetamine-dextroamphetamine 20 MG tablet Commonly known as:  ADDERALL Take 1 tablet by mouth daily.   Hydrocodone-Acetaminophen 5-300 MG Tabs Take 1-2 tablets by mouth every 4 (four) hours as needed.   ibuprofen 800 MG tablet Commonly known as:  ADVIL,MOTRIN Take 1 tablet (800 mg total) by mouth every 8 (eight) hours as needed.   sulfamethoxazole-trimethoprim 400-80 MG tablet Commonly known as:  BACTRIM Take 1 tablet by mouth  2 (two) times daily.      Follow-up Information    Dionne Milo, MD Follow up in 10 day(s).   Specialty:  Surgery Contact information: [address] [place] [address] [phone]        Ronald Lobo, NP .   Specialty:  Nurse Practitioner Contact information: [address] [place] [address] [phone]            Sterling Big, MD FACS

## 2016-09-29 NOTE — Progress Notes (Signed)
Instructions reviewed with the patient.  Pt insisted on walking so I walked with him to his waiting ride

## 2016-09-29 NOTE — Progress Notes (Signed)
Patient hoping to go home.  He tolerated food and drink.   Dr Everlene FarrierPabon called but he is currently in surgery.  I will try to notify Dr Everlene FarrierPabon when he comes out of surgery

## 2016-09-30 ENCOUNTER — Ambulatory Visit (INDEPENDENT_AMBULATORY_CARE_PROVIDER_SITE_OTHER): Payer: BLUE CROSS/BLUE SHIELD | Admitting: Family Medicine

## 2016-09-30 ENCOUNTER — Encounter: Payer: Self-pay | Admitting: Family Medicine

## 2016-09-30 VITALS — BP 125/61 | HR 57 | Temp 98.0°F | Resp 16

## 2016-09-30 DIAGNOSIS — K56609 Unspecified intestinal obstruction, unspecified as to partial versus complete obstruction: Secondary | ICD-10-CM

## 2016-09-30 NOTE — Progress Notes (Signed)
Patient presents today for follow-up regarding recent admission to the hospital for small bowel obstruction. Patient was evaluated by general surgery while in the hospital. Patient improved with belly rest without needing a NG tube placed, etc. He was told by the surgeon that he had likely enteritis which caused his symptoms. He left the hospital yesterday having eaten full diet without any problems. He states that he is able to pass gas and has had a bowel movement. He denies any pain or problems today. Patient has had no history of previous bowel surgery.  ROS: Negative except mentioned above. Vitals as per Epic.  GENERAL: NAD RESP: CTA B CARD: RRR ABD: Positive bowel sounds, nontender, no rebound or guarding appreciated NEURO: CN II-XII grossly intact   A/P: Small bowel obstruction - seems to have resolved, still unsure as to why he would've had this happen, I discussed his case with GI (Dr. Wyline MoodKiran Anna). He suggests that the patient follow-up with him next week. He may need further imaging or a capsule endoscopy. This was discussed with the patient. If patient has any return of symptoms I have asked that he go to the ER since I will be on vacation after tomorrow. Patient addresses understanding. I have asked that he continue to be hydrated with activity. At this time no restrictions are placed for return to baseball activity.

## 2016-10-06 ENCOUNTER — Ambulatory Visit: Payer: Self-pay | Admitting: Gastroenterology

## 2016-10-08 ENCOUNTER — Telehealth: Payer: Self-pay | Admitting: Gastroenterology

## 2016-10-08 NOTE — Telephone Encounter (Signed)
Left voice message for patient to call and reschedule appointment with Dr. Tobi BastosAnna for GI issues. [facility]

## 2017-06-09 ENCOUNTER — Ambulatory Visit
Admission: RE | Admit: 2017-06-09 | Discharge: 2017-06-09 | Disposition: A | Discharge status: Home / Self Care | Payer: BLUE CROSS/BLUE SHIELD | Source: Ambulatory Visit | Attending: Family Medicine | Admitting: Family Medicine

## 2017-06-09 ENCOUNTER — Ambulatory Visit (INDEPENDENT_AMBULATORY_CARE_PROVIDER_SITE_OTHER): Payer: BLUE CROSS/BLUE SHIELD | Admitting: Family Medicine

## 2017-06-09 ENCOUNTER — Encounter: Payer: Self-pay | Admitting: Family Medicine

## 2017-06-09 DIAGNOSIS — M79642 Pain in left hand: Secondary | ICD-10-CM

## 2017-06-09 DIAGNOSIS — M25561 Pain in right knee: Secondary | ICD-10-CM | POA: Diagnosis not present

## 2017-06-09 MED ORDER — DICLOFENAC SODIUM 1 % TD GEL: 2.0000 g | Freq: Two times a day (BID) | TRANSDERMAL | 0 refills | Status: AC

## 2017-06-09 NOTE — Progress Notes (Signed)
Patient presents today with symptoms of right knee pain. Patient states that he experienced the pain last week while running and then changing direction. He denies any significant swelling. He states that most of his knee pain is when he goes into full flexion or extension. He denies any instability. He denies any clicking, popping, locking of the knee. Denies any previous hx of knee pain in the past.  Patient also states that his left hand at times is painful and feels weak. Patient had a fx to his 3rd metacarpal during a game in [place] this spring. He was in a hard removable cast. Over the summer he went home and got clearance to return back to baseball fully. Since that time patient still feels some pain with griping a bat and certain movements when he rotates his hand. Denies any swelling.    ROS: Negative except mentioned above. Vitals as per Epic.  GENERAL: NAD MSK: R Knee - no significant effusion, no tenderness to palpation, FROM, mild discomfort with full extension and flexion, mild discomfort with McMurray, -Lachman, -Drawer, nv intact L Hand - no obvious deformity, FROM of wrist and digits, no tenderness to palpation, nv intact  NEURO: CN II-XII grossly intact   A/P: R Knee Pain - make have some inflammation/synovitis, recommend rest, ice, Voltaren Gel, advancing activity slowly to see what patient can do without pain, if continues to have symptoms recommend further evaluation with Xray, MRI to look at meniscus, etc.   L Hand Pain/Decreased Strength - repeat Xray to make sure fracture looks healed, Voltaren Gel, work on strengthening of forearm, hand muscles, f/u with Ardine Eng if symptoms persist/worsen.

## 2017-06-20 ENCOUNTER — Ambulatory Visit
Admission: RE | Admit: 2017-06-20 | Discharge: 2017-06-20 | Disposition: A | Discharge status: Home / Self Care | Payer: BLUE CROSS/BLUE SHIELD | Source: Ambulatory Visit | Attending: Family Medicine | Admitting: Family Medicine

## 2017-06-20 ENCOUNTER — Other Ambulatory Visit: Payer: Self-pay | Admitting: Family Medicine

## 2017-06-20 DIAGNOSIS — M79642 Pain in left hand: Secondary | ICD-10-CM | POA: Insufficient documentation

## 2017-06-20 DIAGNOSIS — R9389 Abnormal findings on diagnostic imaging of other specified body structures: Secondary | ICD-10-CM | POA: Insufficient documentation

## 2017-06-20 DIAGNOSIS — G8929 Other chronic pain: Secondary | ICD-10-CM

## 2017-10-14 ENCOUNTER — Other Ambulatory Visit: Payer: Self-pay | Admitting: Family Medicine

## 2017-10-14 DIAGNOSIS — M25532 Pain in left wrist: Secondary | ICD-10-CM

## 2017-10-17 ENCOUNTER — Ambulatory Visit
Admission: RE | Admit: 2017-10-17 | Discharge: 2017-10-17 | Disposition: A | Discharge status: Home / Self Care | Payer: BLUE CROSS/BLUE SHIELD | Source: Ambulatory Visit | Attending: Family Medicine | Admitting: Family Medicine

## 2017-10-17 DIAGNOSIS — M25532 Pain in left wrist: Secondary | ICD-10-CM

## 2017-10-17 DIAGNOSIS — S63592A Other specified sprain of left wrist, initial encounter: Secondary | ICD-10-CM | POA: Diagnosis not present

## 2017-10-17 DIAGNOSIS — X58XXXA Exposure to other specified factors, initial encounter: Secondary | ICD-10-CM | POA: Insufficient documentation

## 2017-10-17 MED ORDER — LIDOCAINE VISCOUS 2 % MT SOLN
5.0000 mL | Freq: Once | OROMUCOSAL | Status: AC
  Administered 2017-10-17: 5 mL via OROMUCOSAL
  Filled 2017-10-17: qty 15

## 2017-10-17 MED ORDER — GADOBENATE DIMEGLUMINE 529 MG/ML IV SOLN
5.0000 mL | Freq: Once | INTRAVENOUS | Status: AC | PRN
  Administered 2017-10-17: 0.1 mL via INTRA_ARTICULAR

## 2017-10-17 MED ORDER — SODIUM CHLORIDE 0.9 % IJ SOLN
10.0000 mL | INTRAMUSCULAR | Status: DC | PRN
  Administered 2017-10-17: 10 mL
  Filled 2017-10-17: qty 10

## 2017-10-17 MED ORDER — IOPAMIDOL (ISOVUE-300) INJECTION 61%
50.0000 mL | Freq: Once | INTRAVENOUS | Status: AC | PRN
  Administered 2017-10-17: 50 mL

## 2017-10-21 ENCOUNTER — Ambulatory Visit: Payer: BLUE CROSS/BLUE SHIELD

## 2017-10-21 ENCOUNTER — Other Ambulatory Visit: Payer: BLUE CROSS/BLUE SHIELD

## 2017-12-15 ENCOUNTER — Other Ambulatory Visit: Payer: Self-pay | Admitting: Family Medicine

## 2017-12-15 DIAGNOSIS — G8929 Other chronic pain: Secondary | ICD-10-CM

## 2017-12-15 DIAGNOSIS — M25532 Pain in left wrist: Principal | ICD-10-CM

## 2017-12-19 ENCOUNTER — Ambulatory Visit
Admission: RE | Admit: 2017-12-19 | Discharge: 2017-12-19 | Disposition: A | Discharge status: Home / Self Care | Payer: BLUE CROSS/BLUE SHIELD | Source: Ambulatory Visit | Attending: Family Medicine | Admitting: Family Medicine

## 2017-12-19 DIAGNOSIS — M25532 Pain in left wrist: Secondary | ICD-10-CM | POA: Insufficient documentation

## 2017-12-19 DIAGNOSIS — X58XXXA Exposure to other specified factors, initial encounter: Secondary | ICD-10-CM | POA: Insufficient documentation

## 2017-12-19 DIAGNOSIS — G8929 Other chronic pain: Secondary | ICD-10-CM | POA: Insufficient documentation

## 2017-12-19 DIAGNOSIS — S63592A Other specified sprain of left wrist, initial encounter: Secondary | ICD-10-CM | POA: Diagnosis not present

## 2018-01-11 ENCOUNTER — Ambulatory Visit: Payer: BLUE CROSS/BLUE SHIELD | Attending: Student | Admitting: Occupational Therapy

## 2018-01-11 ENCOUNTER — Encounter: Payer: Self-pay | Admitting: Occupational Therapy

## 2018-01-11 ENCOUNTER — Other Ambulatory Visit: Payer: Self-pay

## 2018-01-11 DIAGNOSIS — M25632 Stiffness of left wrist, not elsewhere classified: Secondary | ICD-10-CM

## 2018-01-11 DIAGNOSIS — M6281 Muscle weakness (generalized): Secondary | ICD-10-CM

## 2018-01-11 DIAGNOSIS — M25532 Pain in left wrist: Secondary | ICD-10-CM

## 2018-01-11 NOTE — Therapy (Signed)
[facility] [address], [place], [address] Phone: [phone]   Fax:  [phone]  Occupational Therapy Evaluation  Patient Details  Name: Paul Whitney MRN: [id number] Date of Birth: 04-Oct-1995 Referring Provider: Grant Fontana   Encounter Date: 01/11/2018  OT End of Session - 01/11/18 1103    Visit Number  1    Number of Visits  12    Date for OT Re-Evaluation  02/22/18    OT Start Time  1004    OT Stop Time  1051    OT Time Calculation (min)  47 min    Activity Tolerance  Patient tolerated treatment well    Behavior During Therapy  [facility] for tasks assessed/performed       Past Medical History:  Diagnosis Date  . ADD (attention deficit disorder)   . Deviated nasal septum   . Nasal obstruction   . Nasal turbinate hypertrophy     Past Surgical History:  Procedure Laterality Date  . SEPTOPLASTY N/A 08/22/2015   Procedure: SEPTOPLASTY;  Surgeon: Linus Salmons, MD;  Location: [facility];  Service: ENT;  Laterality: N/A;  PT WANTS TO BE LATEST APPOINTMENT. HAS CLASS UNTIL 12:30 THAT DAY AT [facility]  . TURBINATE RESECTION N/A 08/22/2015   Procedure: BI SUBMUCOSA TURBINATE RESECTION;  Surgeon: Linus Salmons, MD;  Location: [facility];  Service: ENT;  Laterality: N/A;  . WISDOM TOOTH EXTRACTION      There were no vitals filed for this visit.  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 , like to [employer] , ski and work out       AROM   Left Forearm Pronation  65 Degrees    Left Forearm Supination  80 Degrees    Right Wrist Extension  47 Degrees 56 after fluido    Right Wrist Flexion  28 Degrees with fist after heat 38; and open hand 47    Right Wrist Radial Deviation  17 Degrees    Right Wrist Ulnar Deviation  12 Degrees    Left Wrist Extension  70 Degrees  Left Wrist Flexion  80 Degrees    Left Wrist Radial Deviation  18 Degrees    Left Wrist Ulnar Deviation  28 Degrees       Fluidotherapy done - AROM for L wrist in all planes - pain free  Showed great progress and pain under 2/10   Review HEP and hand out provided :     Heat  And AROM for wrist in all planes   including wrist flexion close hand and open hand  Tendon glides  10 reps each  5 x day              OT Education - 01/11/18 1102    Education provided  Yes    Education Details  findings of eval and HEP     Person(s) Educated  Patient    Methods  Explanation;Demonstration;Tactile cues;Verbal cues;Handout    Comprehension  Returned demonstration;Verbalized understanding       OT Short Term Goals - 01/11/18 1112      OT SHORT TERM GOAL #1   Title  Pt to be independent in HEP to increase AROM of L wrist in all planes to be wean out of splint     Baseline  see flowsheet     Time  3    Period  Weeks    Status  New    Target Date  02/01/18        OT Long Term Goals - 01/11/18 1113      OT LONG TERM GOAL #1    Title  L wrist AROM WNL to use hand in ADL's and light act  without issue or increase pain     Baseline  see flowsheet -wrist in splint with all act     Time  4    Period  Weeks    Status  New    Target Date  02/08/18      OT LONG TERM GOAL #2   Title   L wrist strength increase to pt able to carry more than 5 lbs , turn doorknob, throw ball , cut food     Baseline  no strenght yet - pt 16 days s/p    Time  6    Period  Weeks    Status  New    Target Date  02/22/18      OT LONG TERM GOAL #3   Title  Pain and function score on PRWHW improve with more than 10 points     Baseline  Pain 24/50 and function 14.5/50 at eval     Time  6    Period  Weeks    Status  New    Target Date  02/22/18            Plan - 01/11/18 1104    Clinical Impression Statement  Pt present at eval 16 days s/p TFCC debridement - pt show decrease wrist AROM in all planes , increase pain at wrist and 3rd MC with ROM or gripping - limiting his functional use of L hand in ADL's and IADL's - pt has hx of 3rd MC base fx  more than year ago and then chip removed and report still some deep ache [facility] object - pt in prefab wrist splint - to small scars  healing good -  pt can benefit from OT services     Occupational performance deficits (Please refer to evaluation for details):  ADL's;IADL's;Play;Leisure    Rehab  Potential  Good    Current Impairments/barriers affecting progress:  wrist pain since injury more than year ago when had 3rd MC fx     OT Frequency  2x / week    OT Duration  6 weeks    OT Treatment/Interventions  Self-care/ADL training;Therapeutic exercise;Moist Heat;Fluidtherapy;Paraffin;Splinting;Patient/family education;Passive range of motion;Manual Therapy;Scar mobilization    Plan  progress with AROM - assess if can start AAROM     Clinical Decision Making  Limited treatment options, no task modification necessary    OT Home Exercise Plan  see pt instruction     Consulted and  Agree with Plan of Care  Patient       Patient will benefit from skilled therapeutic intervention in order to improve the following deficits and impairments:  Impaired flexibility, Pain, Decreased scar mobility, Decreased range of motion, Impaired UE functional use, Decreased knowledge of precautions, Decreased strength  Visit Diagnosis: Stiffness of left wrist, not elsewhere classified - Plan: Ot plan of care cert/re-cert  Pain in left wrist - Plan: Ot plan of care cert/re-cert  Muscle weakness (generalized) - Plan: Ot plan of care cert/re-cert    Problem List Patient Active Problem List   Diagnosis Date Noted  . SBO (small bowel obstruction) (HCC) 09/28/2016    Patsy Varma OTR/lL,CLT 01/11/2018, 11:20 AM  [facility] [address] [place], [address] Phone: [phone]   Fax:  [phone]  Name: Paul Whitney MRN: [id number] Date of Birth: [birth year]

## 2018-01-11 NOTE — Patient Instructions (Signed)
Heat  And AROM for wrist in all planes   including wrist flexion close hand and open hand  Tendon glides  10 reps each  5 x day

## 2018-01-17 ENCOUNTER — Ambulatory Visit: Payer: BLUE CROSS/BLUE SHIELD | Admitting: Occupational Therapy

## 2018-01-17 DIAGNOSIS — M25632 Stiffness of left wrist, not elsewhere classified: Secondary | ICD-10-CM | POA: Diagnosis not present

## 2018-01-17 DIAGNOSIS — M25532 Pain in left wrist: Secondary | ICD-10-CM

## 2018-01-17 DIAGNOSIS — M6281 Muscle weakness (generalized): Secondary | ICD-10-CM

## 2018-01-17 NOTE — Therapy (Signed)
[place] [facility] [address] [place], [address] Phone: [phone]   Fax:  [phone]  Occupational Therapy Treatment  Patient Details  Name: Paul Whitney MRN: [id number] Date of Birth: 30-Jul-1996 Referring Provider: Grant Fontana   Encounter Date: 01/17/2018  OT End of Session - 01/17/18 1507    Visit Number  2    Number of Visits  12    Date for OT Re-Evaluation  02/22/18    OT Start Time  1417    OT Stop Time  1446    OT Time Calculation (min)  29 min    Activity Tolerance  Patient tolerated treatment well    Behavior During Therapy  [facility] for tasks assessed/performed       Past Medical History:  Diagnosis Date  . ADD (attention deficit disorder)   . Deviated nasal septum   . Nasal obstruction   . Nasal turbinate hypertrophy     Past Surgical History:  Procedure Laterality Date  . SEPTOPLASTY N/A 08/22/2015   Procedure: SEPTOPLASTY;  Surgeon: Linus Salmons, MD;  Location: [facility];  Service: ENT;  Laterality: N/A;  PT WANTS TO BE LATEST APPOINTMENT. HAS CLASS UNTIL 12:30 THAT DAY AT [facility]  . TURBINATE RESECTION N/A 08/22/2015   Procedure: BI SUBMUCOSA TURBINATE RESECTION;  Surgeon: Linus Salmons, MD;  Location: [facility];  Service: ENT;  Laterality: N/A;  . WISDOM TOOTH EXTRACTION      There were no vitals filed for this visit.  Subjective Assessment - 01/17/18 1431    Subjective   Did the exercises and got in the whirlpool and then splint 50% about time and sleeping -     Patient Stated Goals  Want to get my hand and wrist better so I can play baseball , lift weight and use my hand or arm     Currently in Pain?  No/denies         [facility] OT Assessment - 01/17/18 0001      AROM   Left Forearm Pronation  85 Degrees    Left Forearm Supination  90 Degrees    Right Wrist Extension  70 Degrees    Right Wrist Flexion  50 Degrees fist 40    Right Wrist Radial Deviation  23  Degrees    Right Wrist Ulnar Deviation  24 Degrees       Assess AROM for wrist in all planes - see flowsheet          OT Treatments/Exercises (OP) - 01/17/18 0001      LUE Fluidotherapy   Number Minutes Fluidotherapy  10 Minutes    LUE Fluidotherapy Location  Hand;Wrist    Comments  AROM for wrist in all planes - to increase ROM        add  AAROM for wrist flexion , ext, RD, UD  freview and add to HEP - no pain  -slight pull  10 reps   2-3 x day  Followed by AROM for wrist in all planes - measure - improve again after fluido and HEP review    Also in between HEP  can do prayer stretch and wrist flexion stretch  - hand in fist or open hand  Still pain free for HEP and use        OT Education - 01/17/18 1505    Education provided  Yes    Education Details  Progress and HEP updated     Person(s)  Educated  Patient    Methods  Explanation;Demonstration    Comprehension  Verbalized understanding;Returned demonstration       OT Short Term Goals - 01/11/18 1112      OT SHORT TERM GOAL #1   Title  Pt to be independent in HEP to increase AROM of L wrist in all planes to be wean out of splint     Baseline  see flowsheet     Time  3    Period  Weeks    Status  New    Target Date  02/01/18        OT Long Term Goals - 01/11/18 1113      OT LONG TERM GOAL #1   Title  L wrist AROM WNL to use hand in ADL's and light act  without issue or increase pain     Baseline  see flowsheet -wrist in splint with all act     Time  4    Period  Weeks    Status  New    Target Date  02/08/18      OT LONG TERM GOAL #2   Title   L wrist strength increase to pt able to carry more than 5 lbs , turn doorknob, throw ball , cut food     Baseline  no strenght yet - pt 16 days s/p    Time  6    Period  Weeks    Status  New    Target Date  02/22/18      OT LONG TERM GOAL #3   Title  Pain and function score on PRWHW improve with more than 10 points     Baseline  Pain 24/50 and  function 14.5/50 at eval     Time  6    Period  Weeks    Status  New    Target Date  02/22/18            Plan - 01/17/18 1510    Clinical Impression Statement  Pt is 3 wks s/p TFCC debridement - pt show increase AROM in all planes and no pain at rest - progressing well - add this date AAROM for wrist but should be no pain - slight pull or stretch and AROM -     Occupational performance deficits (Please refer to evaluation for details):  ADL's;IADL's;Play;Leisure    Rehab Potential  Good    Current Impairments/barriers affecting progress:  wrist pain since injury more than year ago when had 3rd MC fx     OT Frequency  2x / week    OT Duration  4 weeks    OT Treatment/Interventions  Self-care/ADL training;Therapeutic exercise;Moist Heat;Fluidtherapy;Paraffin;Splinting;Patient/family education;Passive range of motion;Manual Therapy;Scar mobilization    Plan  progress with AROM - assess if can start AAROM     Clinical Decision Making  Limited treatment options, no task modification necessary    OT Home Exercise Plan  see pt instruction     Consulted and Agree with Plan of Care  Patient       Patient will benefit from skilled therapeutic intervention in order to improve the following deficits and impairments:  Impaired flexibility, Pain, Decreased scar mobility, Decreased range of motion, Impaired UE functional use, Decreased knowledge of precautions, Decreased strength  Visit Diagnosis: Stiffness of left wrist, not elsewhere classified  Pain in left wrist  Muscle weakness (generalized)    Problem List Patient Active Problem List   Diagnosis Date Noted  . SBO (  small bowel obstruction) (HCC) 09/28/2016    Donnelle Olmeda OTR/L, CLT 01/17/2018, 3:11 PM  [place] [facility] PHYSICAL [facility] SPORTS MEDICINE [address], [place], [address] Phone: [phone]   Fax:  [phone]  Name: RAM HAUGAN MRN: [id number] Date of Birth: 1996/04/26

## 2018-01-17 NOTE — Patient Instructions (Signed)
Add AAROM for wrist flexion , ext, RD, UD - no pain  -slight pull  10 reps   2-3 x day  Followed by AROM for wrist in all planes   Also in between can do prayer stretch and wrist flexion strength - hand in fist or open hand

## 2018-01-20 ENCOUNTER — Ambulatory Visit: Payer: BLUE CROSS/BLUE SHIELD | Attending: Student | Admitting: Occupational Therapy

## 2018-01-20 DIAGNOSIS — M25632 Stiffness of left wrist, not elsewhere classified: Secondary | ICD-10-CM

## 2018-01-20 DIAGNOSIS — M6281 Muscle weakness (generalized): Secondary | ICD-10-CM

## 2018-01-20 DIAGNOSIS — M25532 Pain in left wrist: Secondary | ICD-10-CM | POA: Diagnosis present

## 2018-01-20 NOTE — Patient Instructions (Signed)
Cont with AAROM and PROM  After heat  And ice as needed  Add isometric for RD, UD, flexion ,ext in neutral position  10 reps  Increase sets over few days  And then can do end range isometric   Can try 1 lbs weight one set in 6 days if pain free can do in all planes Teal putty can try in 3 days - 2 x 12 reps   2 x day   should be pain free  NOT DO OVER DO

## 2018-01-20 NOTE — Therapy (Signed)
[place] [facility]INE [address] [place], [address] Phone: [phone]   Fax:  [phone]  Occupational Therapy Treatment  Patient Details  Name: Paul Whitney MRN: [id number] Date of Birth: May 23, 1996 Referring Provider: Grant Fontana   Encounter Date: 01/20/2018  OT End of Session - 01/20/18 1419    Visit Number  3    Number of Visits  12    Date for OT Re-Evaluation  02/22/18    OT Start Time  1140    OT Stop Time  1214    OT Time Calculation (min)  34 min    Activity Tolerance  Patient tolerated treatment well    Behavior During Therapy  [facility] for tasks assessed/performed       Past Medical History:  Diagnosis Date  . ADD (attention deficit disorder)   . Deviated nasal septum   . Nasal obstruction   . Nasal turbinate hypertrophy     Past Surgical History:  Procedure Laterality Date  . SEPTOPLASTY N/A 08/22/2015   Procedure: SEPTOPLASTY;  Surgeon: Linus Salmons, MD;  Location: [facility];  Service: ENT;  Laterality: N/A;  PT WANTS TO BE LATEST APPOINTMENT. HAS CLASS UNTIL 12:30 THAT DAY AT [facility]  . TURBINATE RESECTION N/A 08/22/2015   Procedure: BI SUBMUCOSA TURBINATE RESECTION;  Surgeon: Linus Salmons, MD;  Location: [facility];  Service: ENT;  Laterality: N/A;  . WISDOM TOOTH EXTRACTION      There were no vitals filed for this visit.  Subjective Assessment - 01/20/18 1144    Subjective   Doing okay with exercises - pain not more than 1/10 - using more - splint about 25% of time and  wear at night time     Patient Stated Goals  Want to get my hand and wrist better so I can play baseball , lift weight and use my hand or arm     Currently in Pain?  No/denies         [facility] OT Assessment - 01/20/18 0001      AROM   Left Forearm Pronation  85 Degrees    Left Forearm Supination  90 Degrees    Right Wrist Extension  70 Degrees    Right Wrist Flexion  58 Degrees 50 loose fist    Right Wrist Radial Deviation  24 Degrees    Right Wrist Ulnar Deviation  25 Degrees             measurement  Taken  - good progress again   OT Treatments/Exercises (OP) - 01/20/18 0001      LUE Fluidotherapy   Number Minutes Fluidotherapy  10 Minutes    LUE Fluidotherapy Location  Hand;Wrist    Comments  AROM for composite fist with flexion wrist , ext wrist , pronation       soft tissue mobs graston tool nr 2 for sweeping over volar and dorsal forearm and wrist    progress after fluido in AROM for wrist   AAROM for wrist flexion composite and ext  And RD, UD over edge of table done   10 reps        cont with these for HEP and can Also in between HEP  can do prayer stretch and wrist flexion stretch  - hand in fist or open hand  Still pain free for HEP and use   isometric for RD, UD, flexion ,ext in neutral position  10 reps  Increase sets over  few days  And then can do end range isometric  In few days if pain free   Can try 1 lbs weight one set in 6 days if pain free -  can do in all planes Teal putty was pain free in clinic this date for 10 reps  Pt  can try in 3 days - 2 x 12 reps   2 x day   should be pain free  NOT DO OVER DO       OT Education - 01/20/18 1416    Education provided  Yes    Education Details  initiate this date strengthening     Person(s) Educated  Patient    Methods  Explanation;Demonstration;Handout    Comprehension  Verbalized understanding;Returned demonstration       OT Short Term Goals - 01/11/18 1112      OT SHORT TERM GOAL #1   Title  Pt to be independent in HEP to increase AROM of L wrist in all planes to be wean out of splint     Baseline  see flowsheet     Time  3    Period  Weeks    Status  New    Target Date  02/01/18        OT Long Term Goals - 01/11/18 1113      OT LONG TERM GOAL #1   Title  L wrist AROM WNL to use hand in ADL's and light act  without issue or increase pain     Baseline  see flowsheet  -wrist in splint with all act     Time  4    Period  Weeks    Status  New    Target Date  02/08/18      OT LONG TERM GOAL #2   Title   L wrist strength increase to pt able to carry more than 5 lbs , turn doorknob, throw ball , cut food     Baseline  no strenght yet - pt 16 days s/p    Time  6    Period  Weeks    Status  New    Target Date  02/22/18      OT LONG TERM GOAL #3   Title  Pain and function score on PRWHW improve with more than 10 points     Baseline  Pain 24/50 and function 14.5/50 at eval     Time  6    Period  Weeks    Status  New    Target Date  02/22/18            Plan - 01/20/18 1420    Clinical Impression Statement  Pt is 3 1/2 wks s/p for TFCC debridement - pt tolerating AAROM and PROM - add this date isometric strengthening neutral position - he can try at 4 1/w wks end range if pain free - hold off - can also try 1 lbs at 4 1/2 wks if pain free can do -  medium putty add for to start after 4 wks s/p pain free - but start  low reps and sets nad increase gradually - pain free - will reassess and upgrade in week     Occupational performance deficits (Please refer to evaluation for details):  ADL's;IADL's;Play;Leisure    Rehab Potential  Good    OT Frequency  1x / week    OT Duration  4 weeks    OT Treatment/Interventions  Self-care/ADL training;Therapeutic exercise;Moist Heat;Fluidtherapy;Paraffin;Splinting;Patient/family education;Passive  range of motion;Manual Therapy;Scar mobilization    Plan  progress with AROM - and progress with strengthening - upgrade to tolerance     Clinical Decision Making  Limited treatment options, no task modification necessary    OT Home Exercise Plan  see pt instruction     Consulted and Agree with Plan of Care  Patient       Patient will benefit from skilled therapeutic intervention in order to improve the following deficits and impairments:  Impaired flexibility, Pain, Decreased scar mobility, Decreased range of motion,  Impaired UE functional use, Decreased knowledge of precautions, Decreased strength  Visit Diagnosis: Stiffness of left wrist, not elsewhere classified  Pain in left wrist  Muscle weakness (generalized)    Problem List Patient Active Problem List   Diagnosis Date Noted  . SBO (small bowel obstruction) (HCC) 09/28/2016    Oletta Cohn OTR/L,CLT 01/20/2018, 2:24 PM  [place] [facility]TS [facility]ICINE [address], [place], [address] Phone: [phone]   Fax:  [phone]  Name: Paul Whitney MRN: [id number] Date of Birth: [birth year]

## 2018-01-27 ENCOUNTER — Ambulatory Visit: Payer: BLUE CROSS/BLUE SHIELD | Admitting: Occupational Therapy

## 2018-01-27 DIAGNOSIS — M25532 Pain in left wrist: Secondary | ICD-10-CM

## 2018-01-27 DIAGNOSIS — M25632 Stiffness of left wrist, not elsewhere classified: Secondary | ICD-10-CM

## 2018-01-27 DIAGNOSIS — M6281 Muscle weakness (generalized): Secondary | ICD-10-CM

## 2018-01-27 NOTE — Patient Instructions (Signed)
Cont with ROM and stretches - focus on wrist flexion - and composite flexion  1 lbs for wrist flexion and extention  And then can do 2 lbs for RD, UD , sup /pro  10 reps  And increase gradually sets again over few days  and putty upgrade to green - same gradually increase sets again  Pain free

## 2018-01-27 NOTE — Therapy (Signed)
[place] [facility] [address], [place], [address] Phone: [phone]   Fax:  [phone]  Occupational Therapy Treatment  Patient Details  Name: Paul Whitney MRN: [id number] Date of Birth: 1995-11-09 Referring Provider: Grant Fontana   Encounter Date: 01/27/2018  OT End of Session - 01/27/18 1443    Visit Number  4    Number of Visits  12    Date for OT Re-Evaluation  02/22/18    OT Start Time  1348    OT Stop Time  1413    OT Time Calculation (min)  25 min    Activity Tolerance  Patient tolerated treatment well    Behavior During Therapy  [facility] for tasks assessed/performed       Past Medical History:  Diagnosis Date  . ADD (attention deficit disorder)   . Deviated nasal septum   . Nasal obstruction   . Nasal turbinate hypertrophy     Past Surgical History:  Procedure Laterality Date  . SEPTOPLASTY N/A 08/22/2015   Procedure: SEPTOPLASTY;  Surgeon: Linus Salmons, MD;  Location: [facility];  Service: ENT;  Laterality: N/A;  PT WANTS TO BE LATEST APPOINTMENT. HAS CLASS UNTIL 12:30 THAT DAY AT [facility]  . TURBINATE RESECTION N/A 08/22/2015   Procedure: BI SUBMUCOSA TURBINATE RESECTION;  Surgeon: Linus Salmons, MD;  Location: [facility];  Service: ENT;  Laterality: N/A;  . WISDOM TOOTH EXTRACTION      There were no vitals filed for this visit.  Subjective Assessment - 01/27/18 1439    Subjective   Doing okay- I did the stretches and then isometric - and now at 1 lbs 3 sets and putty 10 reps , 3 sets - no pain = maybe little with 1 lbs - like pinch but less than 2/10- I not feel the pop anymore with my hand on chest  - but do feel painless pop when gripping my bat - and I had that prior to surgery     Patient Stated Goals  Want to get my hand and wrist better so I can play baseball , lift weight and use my hand or arm     Currently in Pain?  Yes    Pain Score  2     Pain Location  Wrist    Pain Orientation  Right    Pain Descriptors / Indicators  -- Pinch    Pain Type  Surgical pain    Aggravating Factors   UD or wrist extention 1 lbs          [facility] OT Assessment - 01/27/18 0001      AROM   Left Forearm Pronation  85 Degrees    Left Forearm Supination  90 Degrees    Right Wrist Extension  70 Degrees    Right Wrist Flexion  80 Degrees    Left Wrist Extension  68 Degrees    Left Wrist Flexion  70 Degrees 52 in fist     Left Wrist Radial Deviation  25 Degrees    Left Wrist Ulnar Deviation  30 Degrees      Strength   Right Hand Grip (lbs)  125    Left Hand Grip (lbs)  80         measurement taken for AROM for L wrist  Flow sheet - R and L measurements was switch since eval - was corrected this date   Pt to cont with HEP for ROM and  betweenHEPcan do prayer stretch and wrist flexion stretch- hand in fist or open hand  Still pain free for HEP and use  Pt was able to do 10 reps   2lbs for wrist sup/pro, RD, UD - pain free  10 reps   can do at home and increase sets gradually over week  Cont with  1 lbs weight for wrist flexion - gentle weighted stretch and then   horisontal plane wrist ext, flexion 1 lbs   10 reps  2-3 sets - if pain free   Upgrade to green  Putty  12 reps  And gradually increase this week to 3 sets if pain free   All shoulde be pain free or like 1/10  NOT TO OVER DO                OT Education - 01/27/18 1443    Education provided  Yes    Education Details  progress and upgrade HEP     Person(s) Educated  Patient    Methods  Explanation;Demonstration    Comprehension  Verbalized understanding;Returned demonstration       OT Short Term Goals - 01/11/18 1112      OT SHORT TERM GOAL #1   Title  Pt to be independent in HEP to increase AROM of L wrist in all planes to be wean out of splint     Baseline  see flowsheet     Time  3    Period  Weeks    Status  New    Target Date  02/01/18        OT Long Term  Goals - 01/11/18 1113      OT LONG TERM GOAL #1   Title  L wrist AROM WNL to use hand in ADL's and light act  without issue or increase pain     Baseline  see flowsheet -wrist in splint with all act     Time  4    Period  Weeks    Status  New    Target Date  02/08/18      OT LONG TERM GOAL #2   Title   L wrist strength increase to pt able to carry more than 5 lbs , turn doorknob, throw ball , cut food     Baseline  no strenght yet - pt 16 days s/p    Time  6    Period  Weeks    Status  New    Target Date  02/22/18      OT LONG TERM GOAL #3   Title  Pain and function score on PRWHW improve with more than 10 points     Baseline  Pain 24/50 and function 14.5/50 at eval     Time  6    Period  Weeks    Status  New    Target Date  02/22/18            Plan - 01/27/18 1444    Clinical Impression Statement  Pt just over 4 1/2 s/p for TFCC debridement - pt showing great progress in all AROM - except wrist flexion and composite flexion still decrease by 25 degrees - pt to work on ROM - and was able to do 1 lbs the last few days  3 sets of 10 - with pain end range for extention and UD - 1-2/10 pinch  - grip 80 lbs this date L and R 125 lbs  for baseline - pt  to see surgeon next week     Occupational performance deficits (Please refer to evaluation for details):  ADL's;IADL's;Play;Leisure    Rehab Potential  Good    Current Impairments/barriers affecting progress:  wrist pain since injury more than year ago when had 3rd MC fx     OT Frequency  1x / week    OT Duration  2 weeks    OT Treatment/Interventions  Self-care/ADL training;Therapeutic exercise;Moist Heat;Fluidtherapy;Paraffin;Splinting;Patient/family education;Passive range of motion;Manual Therapy;Scar mobilization    Plan  assess progress in wrist flexion -and tolerance for 1-2 lbs weight - results from surgeon visit -     Clinical Decision Making  Limited treatment options, no task modification necessary    OT Home Exercise  Plan  see pt instruction     Consulted and Agree with Plan of Care  Patient       Patient will benefit from skilled therapeutic intervention in order to improve the following deficits and impairments:  Impaired flexibility, Pain, Decreased scar mobility, Decreased range of motion, Impaired UE functional use, Decreased knowledge of precautions, Decreased strength  Visit Diagnosis: Stiffness of left wrist, not elsewhere classified  Pain in left wrist  Muscle weakness (generalized)    Problem List Patient Active Problem List   Diagnosis Date Noted  . SBO (small bowel obstruction) (HCC) 09/28/2016    Oletta Cohn OTR/L,CLT 01/27/2018, 2:53 PM  [place] [facility]TS MEDICINE [address] [place], [address] Phone: [phone]   Fax:  [phone]  Name: Paul Whitney MRN: [id number] Date of Birth: 08-11-1996

## 2018-09-18 IMAGING — MR MR WRIST*L* W/O CM
5 of 6 series · 32 of 40 positions shown · non-contrast
Comparison: MR arthrogram of the left wrist dated October 17, 2017.

CLINICAL DATA: Ulnar-sided wrist pain for the past month. No known
injury.

EXAM:
MR OF THE LEFT WRIST WITHOUT CONTRAST
TECHNIQUE: Multiplanar, multisequence MR imaging of the left wrist was
performed. No intravenous contrast was administered.

[Series 4: T2 fat-sat · oblique · 3.0mm · 0.43mm/px · 8 of 27 slices shown]
[im 1/27]
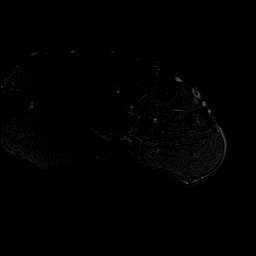
[im 4/27]
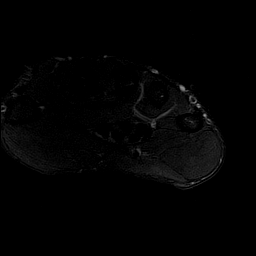
[im 8/27]
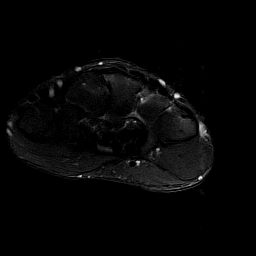
[im 12/27]
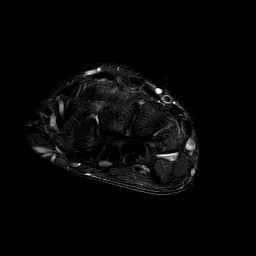
[im 15/27]
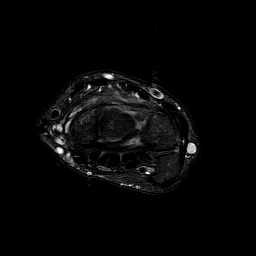
[im 19/27]
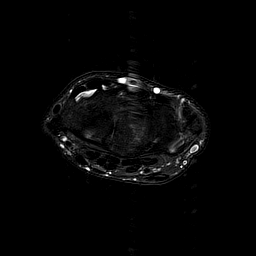
[im 23/27]
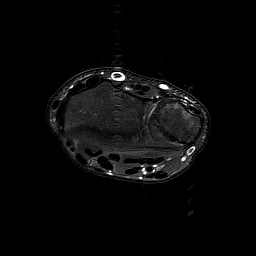
[im 27/27]
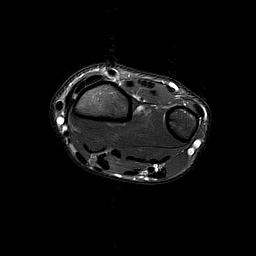

[Series 5: T1 · oblique · 3.0mm · 0.43mm/px · 8 of 27 slices shown (1 of 2)]
[im 1/27]
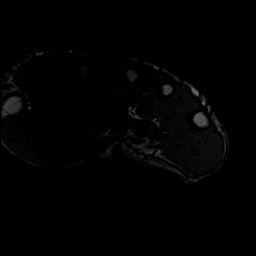
[im 4/27]
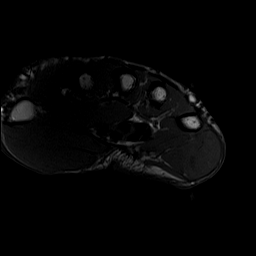
[im 8/27]
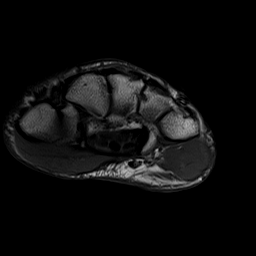
[im 12/27]
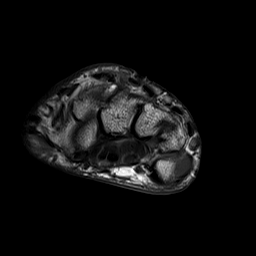
[im 15/27]
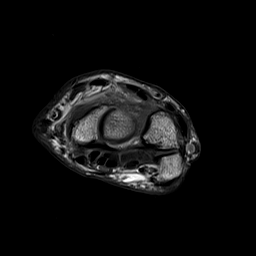
[im 19/27]
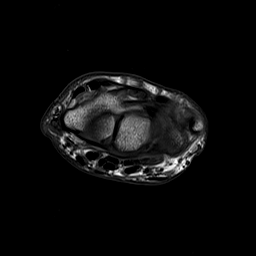
[im 23/27]
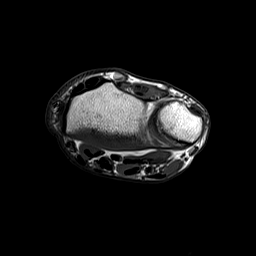
[im 27/27]
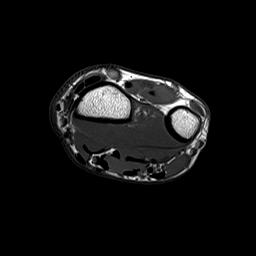

[Series 100: T1 · coronal · 3.0mm · 0.43mm/px · 2 of 20 slices shown (2 of 2)]
[im 1/20]
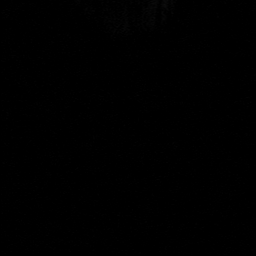
[im 5/20]
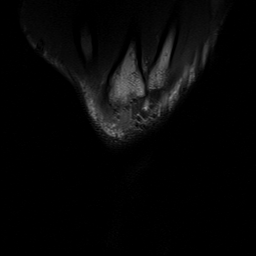

[Series 102: PD fat-sat · coronal · 3.0mm · 0.21mm/px · 5 of 20 slices shown (1 of 2)]
[im 1/20]
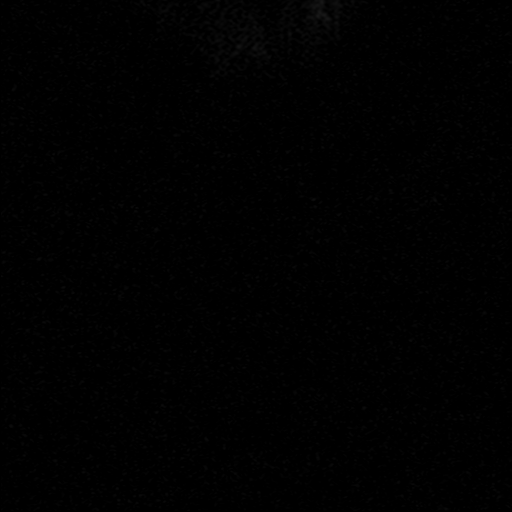
[im 5/20]
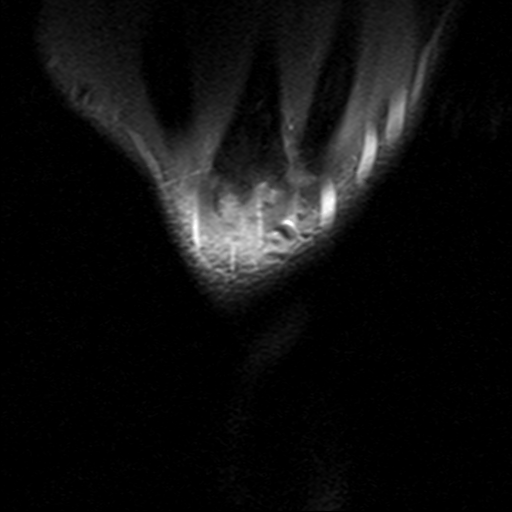
[im 10/20]
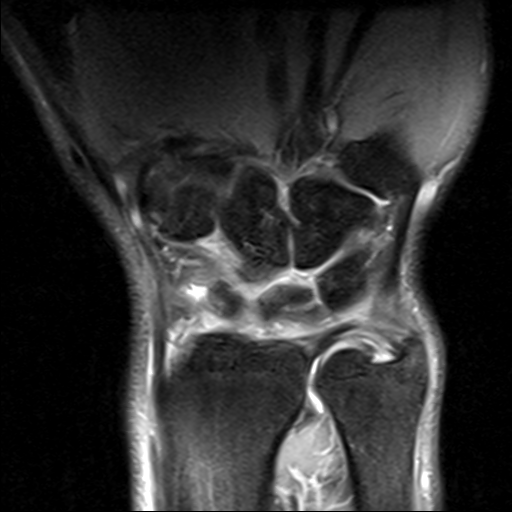
[im 15/20]
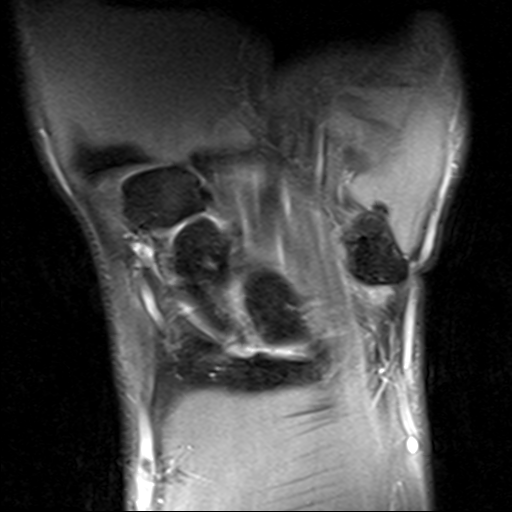
[im 20/20]
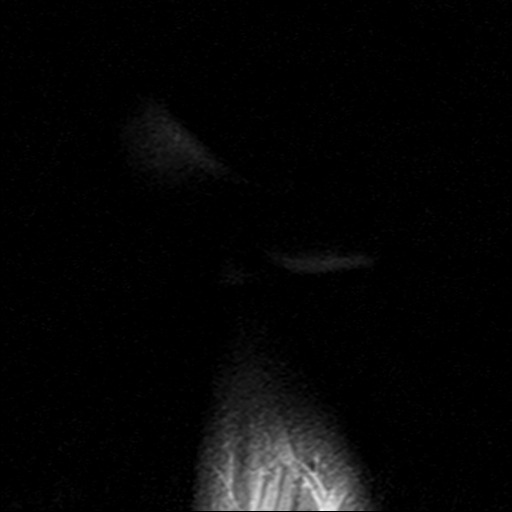

[Series 103: PD fat-sat · oblique · 3.0mm · 0.21mm/px · 9 of 33 slices shown (2 of 2)]
[im 1/33]
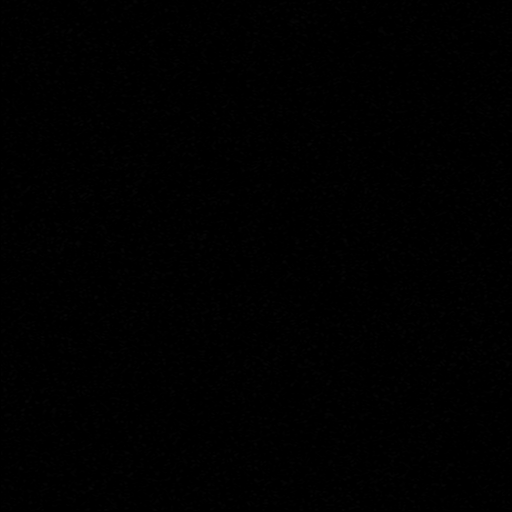
[im 5/33]
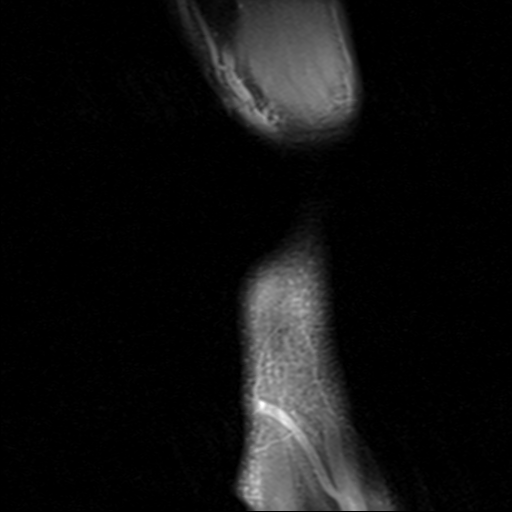
[im 9/33]
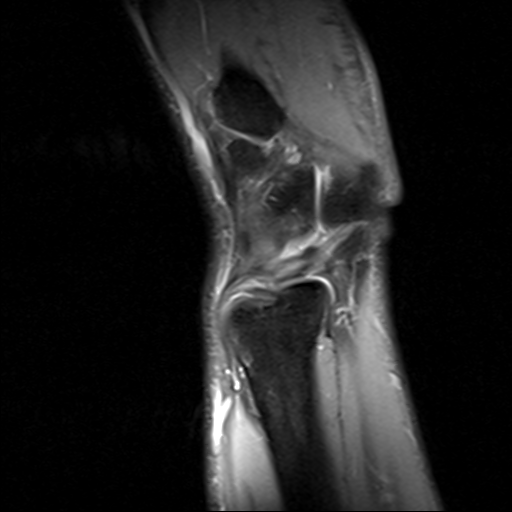
[im 13/33]
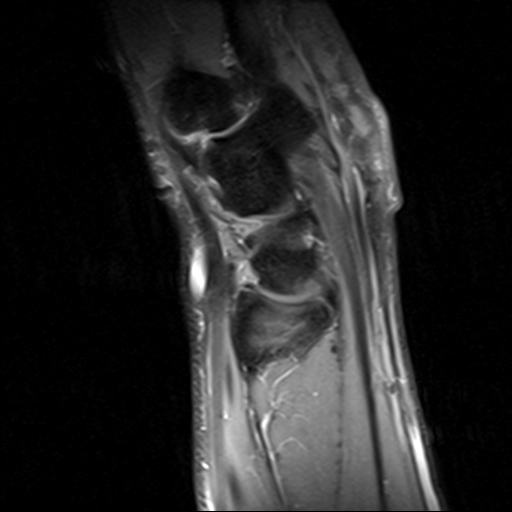
[im 17/33]
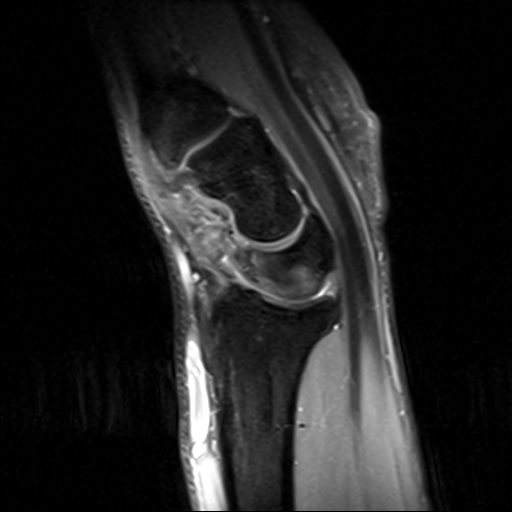
[im 21/33]
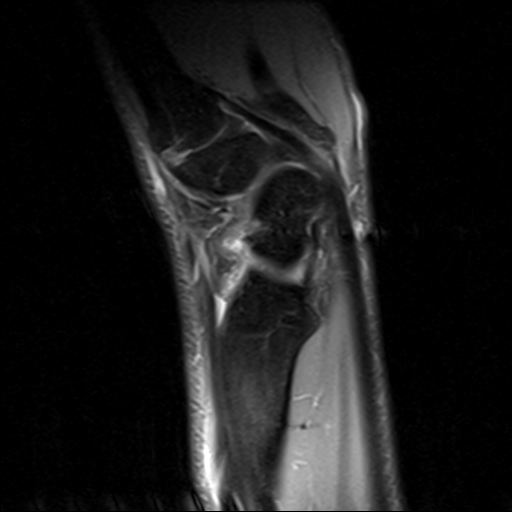
[im 25/33]
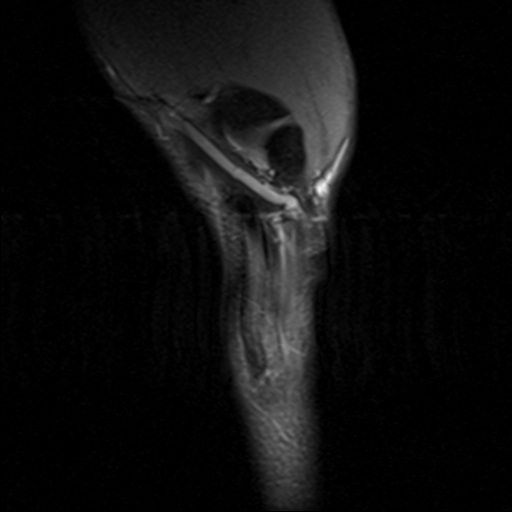
[im 29/33]
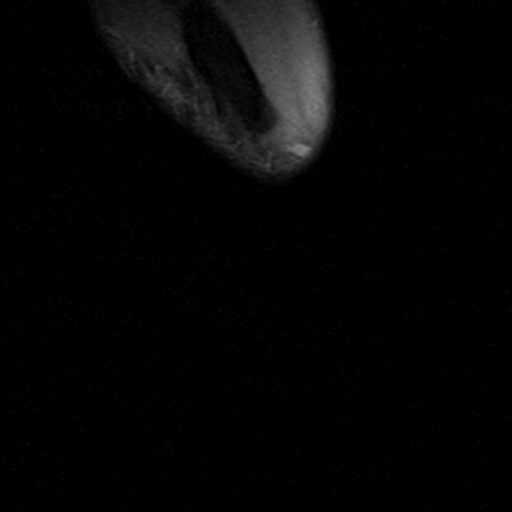
[im 33/33]
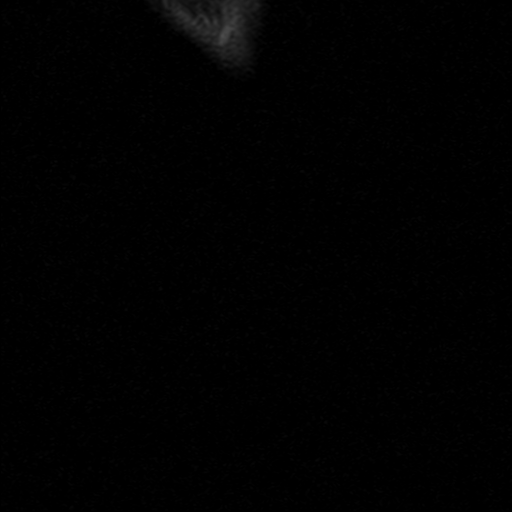

[32 of 40 positions shown; findings below may reference images not displayed]

FINDINGS: Ligaments: Intact scapholunate and lunotriquetral ligaments.

Triangular fibrocartilage: Prominent increased intermediate signal
within the articular disc in a similar pattern to the tear seen on
prior MR arthrogram.

Tendons: Intact flexor and extensor compartment tendons.

Carpal tunnel/median nerve: Normal carpal tunnel. Normal median
nerve.

Guyon's canal: Normal.

Joint/cartilage: No joint effusion. No chondral defect.

Bones/carpal alignment: No marrow signal abnormality. Normal
alignment. No aggressive osseous lesion.

Other: Postsurgical changes along the dorsal base of the metacarpals
from prior meta[REDACTED] surgery.
IMPRESSION: 1. Large central tear of the TFCC, unchanged.
